# Patient Record
Sex: Male | Born: 1935 | Race: White | Hispanic: No | Marital: Married | Smoking: Current every day smoker
Health system: Southern US, Community
[De-identification: ages and names within clinical notes are randomized; demographics above are authoritative.]

## PROBLEM LIST (undated history)

## (undated) DIAGNOSIS — I059 Rheumatic mitral valve disease, unspecified: Secondary | ICD-10-CM

## (undated) DIAGNOSIS — J189 Pneumonia, unspecified organism: Secondary | ICD-10-CM

## (undated) DIAGNOSIS — I4891 Unspecified atrial fibrillation: Secondary | ICD-10-CM

## (undated) DIAGNOSIS — I10 Essential (primary) hypertension: Secondary | ICD-10-CM

## (undated) DIAGNOSIS — N400 Enlarged prostate without lower urinary tract symptoms: Secondary | ICD-10-CM

## (undated) DIAGNOSIS — E785 Hyperlipidemia, unspecified: Secondary | ICD-10-CM

## (undated) HISTORY — PX: CATARACT EXTRACTION: SUR2

## (undated) HISTORY — PX: OTHER SURGICAL HISTORY: SHX169

## (undated) HISTORY — PX: TONSILLECTOMY AND ADENOIDECTOMY: SHX28

## (undated) NOTE — *Deleted (*Deleted)
Took the patient to the bathroom to try to urinate again but was unsuccessful

---

## 2006-09-23 ENCOUNTER — Ambulatory Visit: Payer: Self-pay | Admitting: Specialist

## 2011-06-11 DIAGNOSIS — I10 Essential (primary) hypertension: Secondary | ICD-10-CM | POA: Insufficient documentation

## 2011-06-11 DIAGNOSIS — Z8619 Personal history of other infectious and parasitic diseases: Secondary | ICD-10-CM | POA: Insufficient documentation

## 2015-06-12 DIAGNOSIS — R002 Palpitations: Secondary | ICD-10-CM | POA: Insufficient documentation

## 2016-06-13 ENCOUNTER — Encounter: Payer: Self-pay | Admitting: Urology

## 2016-06-13 ENCOUNTER — Ambulatory Visit (INDEPENDENT_AMBULATORY_CARE_PROVIDER_SITE_OTHER): Payer: Medicare Other | Admitting: Urology

## 2016-06-13 DIAGNOSIS — N400 Enlarged prostate without lower urinary tract symptoms: Secondary | ICD-10-CM | POA: Insufficient documentation

## 2016-06-13 DIAGNOSIS — K759 Inflammatory liver disease, unspecified: Secondary | ICD-10-CM | POA: Insufficient documentation

## 2016-06-13 DIAGNOSIS — I10 Essential (primary) hypertension: Secondary | ICD-10-CM | POA: Insufficient documentation

## 2016-06-13 DIAGNOSIS — E782 Mixed hyperlipidemia: Secondary | ICD-10-CM | POA: Insufficient documentation

## 2016-06-13 LAB — MICROSCOPIC EXAMINATION: BACTERIA UA: NONE SEEN

## 2016-06-13 LAB — URINALYSIS, COMPLETE
BILIRUBIN UA: NEGATIVE
GLUCOSE, UA: NEGATIVE
KETONES UA: NEGATIVE
LEUKOCYTES UA: NEGATIVE
Nitrite, UA: NEGATIVE
Protein, UA: NEGATIVE
SPEC GRAV UA: 1.015 (ref 1.005–1.030)
Urobilinogen, Ur: 0.2 mg/dL (ref 0.2–1.0)
pH, UA: 7 (ref 5.0–7.5)

## 2016-06-13 LAB — BLADDER SCAN AMB NON-IMAGING: Scan Result: 78

## 2016-06-13 NOTE — Progress Notes (Signed)
06/13/2016 9:18 AM   Evan Ramirez  06-15-1930 [id number]  Referring provider: No referring provider defined for this encounter.  Chief Complaint  Patient presents with  . Follow-up    BPH    HPI: The patient is an 82 year old male with past medical history of BPH  who presents for follow up. The patient is concerned that his recent PSA was 4.39. He is having no urinary symptoms at this time. He is on no medications. He denies nocturia, frequency, urgency, or incomplete bladder emptying. His PVR 78 cc.   PMH: No past medical history on file.  Surgical History: No past surgical history on file.  Home Medications:    Medication List       Accurate as of 06/13/16  9:18 AM. Always use your most recent med list.          aspirin EC 81 MG tablet Take by mouth.   atenolol 25 MG tablet Commonly known as:  TENORMIN Take by mouth.   hydrocortisone 2.5 % cream Apply 1-2x daily to affected area on face for redness and scale   lisinopril-hydrochlorothiazide 20-12.5 MG tablet Commonly known as:  PRINZIDE,ZESTORETIC Take by mouth.   Omega-3 1000 MG Caps Take by mouth.       Allergies: Not on File  Family History: No family history on file.  Social History:  reports that he has been smoking.  He has been smoking about 0.75 packs per day. He has never used smokeless tobacco. He reports that he does not drink alcohol or use drugs.  ROS: UROLOGY Frequent Urination?: Yes Hard to postpone urination?: Yes Burning/pain with urination?: No Get up at night to urinate?: Yes Leakage of urine?: No Urine stream starts and stops?: No Trouble starting stream?: No Do you have to strain to urinate?: No Blood in urine?: Yes Urinary tract infection?: No Sexually transmitted disease?: No Injury to kidneys or bladder?: No Painful intercourse?: No Weak stream?: Yes Erection problems?: No Penile pain?: No  Gastrointestinal Nausea?: No Vomiting?:  No Indigestion/heartburn?: No Diarrhea?: No Constipation?: No  Constitutional Fever: No Night sweats?: No Weight loss?: No Fatigue?: No  Skin Skin rash/lesions?: No Itching?: No  Eyes Blurred vision?: No Double vision?: No  Ears/Nose/Throat Sore throat?: No Sinus problems?: No  Hematologic/Lymphatic Swollen glands?: No Easy bruising?: No  Cardiovascular Leg swelling?: No Chest pain?: No  Respiratory Cough?: No Shortness of breath?: No  Endocrine Excessive thirst?: No  Musculoskeletal Back pain?: No Joint pain?: No  Neurological Headaches?: No Dizziness?: No  Psychologic Depression?: No Anxiety?: No  Physical Exam: BP (!) 168/71   Pulse (!) 51   Ht 5\' 9"  (1.753 m)   Wt 173 lb 6.4 oz (78.7 kg)   BMI 25.61 kg/m   Constitutional:  Alert and oriented, No acute distress. HEENT: [place] AT, moist mucus membranes.  Trachea midline, no masses. Cardiovascular: No clubbing, cyanosis, or edema. Respiratory: Normal respiratory effort, no increased work of breathing. GI: Abdomen is soft, nontender, nondistended, no abdominal masses GU: No CVA tenderness.  DRE; 2+, smooth, benign. Skin: No rashes, bruises or suspicious lesions. Lymph: No cervical or inguinal adenopathy. Neurologic: Grossly intact, no focal deficits, moving all 4 extremities. Psychiatric: Normal mood and affect.  Laboratory Data: No results found for: WBC, HGB, HCT, MCV, PLT  No results found for: CREATININE  No results found for: PSA  No results found for: TESTOSTERONE  No results found for: HGBA1C  Urinalysis No results found for: COLORURINE, APPEARANCEUR, [place], [place], [place],  HGBUR, BILIRUBINUR, KETONESUR, PROTEINUR, UROBILINOGEN, NITRITE, LEUKOCYTESUR  Assessment & Plan:   1. BPH -asymptomatic  2. Prostate cancer screening I had a very long conversation with the patient regarding PSA testing as well as the natural history of prostate cancer. We discussed in great detail  the American urological Association's recommendation for PSA testing and management from 55-70. We also discussed it's recommended not to check PSAs after the age of 12 and at the very least stop by the age of 70. We discussed for an 82 year old that PSA of 4.39 is essentially normal. We also discussed the indolent nature of prostate cancer. We discussed autopsy studies that showed 80% of men that died from something other than prostate cancer did have prostate cancer. We discussed the goal of PSA screening is to identify younger men who have more aggressive forms of disease. We also discussed that even in younger men a large amount of prostate cancer is not even actively treated due to the risks and side effects of treatment being worse than the disease itself. We discussed that on average prostate cancer if it were to spread would not spread from 8-10 years from diagnosis and not be malignant for 13-15 years after diagnosis. We again discussed that his PSA is essentially normal at the age of 41 with 4.39. I explained to the patient the recommendation at the age of 54 with essentially normal PSA that most urologists would recommend against further prostate cancer screening that I think it would be a good idea his PSA is no longer check. The patient expressed understanding. He'll follow-up with [place] as needed.   Return if symptoms worsen or fail to improve.  Nickie Retort, MD  [facility] [address], [place], [place] [address] [phone]

## 2018-08-30 ENCOUNTER — Inpatient Hospital Stay
Admission: EM | Admit: 2018-08-30 | Discharge: 2018-09-01 | DRG: 191 | Disposition: A | Discharge status: Home / Self Care | Payer: Medicare Other | Attending: Internal Medicine | Admitting: Internal Medicine

## 2018-08-30 ENCOUNTER — Other Ambulatory Visit: Payer: Self-pay

## 2018-08-30 ENCOUNTER — Encounter: Payer: Self-pay | Admitting: *Deleted

## 2018-08-30 ENCOUNTER — Emergency Department: Payer: Medicare Other

## 2018-08-30 DIAGNOSIS — F17210 Nicotine dependence, cigarettes, uncomplicated: Secondary | ICD-10-CM | POA: Diagnosis present

## 2018-08-30 DIAGNOSIS — N179 Acute kidney failure, unspecified: Secondary | ICD-10-CM | POA: Diagnosis present

## 2018-08-30 DIAGNOSIS — Z7982 Long term (current) use of aspirin: Secondary | ICD-10-CM | POA: Diagnosis not present

## 2018-08-30 DIAGNOSIS — E782 Mixed hyperlipidemia: Secondary | ICD-10-CM | POA: Diagnosis present

## 2018-08-30 DIAGNOSIS — I4891 Unspecified atrial fibrillation: Secondary | ICD-10-CM

## 2018-08-30 DIAGNOSIS — N400 Enlarged prostate without lower urinary tract symptoms: Secondary | ICD-10-CM | POA: Diagnosis present

## 2018-08-30 DIAGNOSIS — Z9849 Cataract extraction status, unspecified eye: Secondary | ICD-10-CM | POA: Diagnosis not present

## 2018-08-30 DIAGNOSIS — I10 Essential (primary) hypertension: Secondary | ICD-10-CM | POA: Diagnosis present

## 2018-08-30 DIAGNOSIS — Z8249 Family history of ischemic heart disease and other diseases of the circulatory system: Secondary | ICD-10-CM | POA: Diagnosis not present

## 2018-08-30 DIAGNOSIS — I471 Supraventricular tachycardia, unspecified: Secondary | ICD-10-CM | POA: Diagnosis present

## 2018-08-30 DIAGNOSIS — E860 Dehydration: Secondary | ICD-10-CM | POA: Diagnosis present

## 2018-08-30 DIAGNOSIS — I480 Paroxysmal atrial fibrillation: Secondary | ICD-10-CM | POA: Diagnosis present

## 2018-08-30 DIAGNOSIS — J441 Chronic obstructive pulmonary disease with (acute) exacerbation: Principal | ICD-10-CM | POA: Diagnosis present

## 2018-08-30 DIAGNOSIS — Z79899 Other long term (current) drug therapy: Secondary | ICD-10-CM | POA: Diagnosis not present

## 2018-08-30 HISTORY — DX: Unspecified atrial fibrillation: I48.91

## 2018-08-30 HISTORY — DX: Essential (primary) hypertension: I10

## 2018-08-30 LAB — BASIC METABOLIC PANEL
Anion gap: 11 (ref 5–15)
BUN: 29 mg/dL — ABNORMAL HIGH (ref 8–23)
CO2: 23 mmol/L (ref 22–32)
CREATININE: 1.35 mg/dL — AB (ref 0.61–1.24)
Calcium: 9.3 mg/dL (ref 8.9–10.3)
Chloride: 101 mmol/L (ref 98–111)
GFR calc Af Amer: 55 mL/min — ABNORMAL LOW (ref >60)
GFR calc non Af Amer: 47 mL/min — ABNORMAL LOW (ref >60)
Glucose, Bld: 126 mg/dL — ABNORMAL HIGH (ref 70–99)
Potassium: 4.2 mmol/L (ref 3.5–5.1)
SODIUM: 135 mmol/L (ref 135–145)

## 2018-08-30 LAB — CBC
HCT: 42.6 % (ref 39.0–52.0)
Hemoglobin: 13.9 g/dL (ref 13.0–17.0)
MCH: 29.4 pg (ref 26.0–34.0)
MCHC: 32.6 g/dL (ref 30.0–36.0)
MCV: 90.3 fL (ref 80.0–100.0)
Platelets: 284 10*3/uL (ref 150–400)
RBC: 4.72 MIL/uL (ref 4.22–5.81)
RDW: 13.6 % (ref 11.5–15.5)
WBC: 10.4 10*3/uL (ref 4.0–10.5)
nRBC: 0 % (ref 0.0–0.2)

## 2018-08-30 LAB — TROPONIN I
Troponin I: 0.04 ng/mL (ref <0.03)
Troponin I: 0.05 ng/mL (ref <0.03)
Troponin I: 0.05 ng/mL (ref <0.03)

## 2018-08-30 LAB — RESPIRATORY PANEL BY PCR
ADENOVIRUS-RVPPCR: NOT DETECTED
Bordetella pertussis: NOT DETECTED
CHLAMYDOPHILA PNEUMONIAE-RVPPCR: NOT DETECTED
CORONAVIRUS NL63-RVPPCR: NOT DETECTED
Coronavirus 229E: NOT DETECTED
Coronavirus HKU1: NOT DETECTED
Coronavirus OC43: NOT DETECTED
Influenza A: NOT DETECTED
Influenza B: NOT DETECTED
Metapneumovirus: NOT DETECTED
Mycoplasma pneumoniae: NOT DETECTED
Parainfluenza Virus 1: NOT DETECTED
Parainfluenza Virus 2: NOT DETECTED
Parainfluenza Virus 3: NOT DETECTED
Parainfluenza Virus 4: NOT DETECTED
Respiratory Syncytial Virus: NOT DETECTED
Rhinovirus / Enterovirus: NOT DETECTED

## 2018-08-30 LAB — INFLUENZA PANEL BY PCR (TYPE A & B)
INFLBPCR: NEGATIVE
Influenza A By PCR: NEGATIVE

## 2018-08-30 LAB — MAGNESIUM: Magnesium: 2.2 mg/dL (ref 1.7–2.4)

## 2018-08-30 LAB — T4, FREE: Free T4: 1.42 ng/dL (ref 0.82–1.77)

## 2018-08-30 LAB — PROTIME-INR
INR: 1.14
Prothrombin Time: 14.5 seconds (ref 11.4–15.2)

## 2018-08-30 LAB — APTT: aPTT: 36 seconds (ref 24–36)

## 2018-08-30 LAB — TSH: TSH: 3.828 u[IU]/mL (ref 0.350–4.500)

## 2018-08-30 MED ORDER — ENOXAPARIN SODIUM 40 MG/0.4ML ~~LOC~~ SOLN
40.0000 mg | SUBCUTANEOUS | Status: DC
  Administered 2018-08-30 – 2018-08-31 (×2): 40 mg via SUBCUTANEOUS
  Filled 2018-08-30 (×2): qty 0.4

## 2018-08-30 MED ORDER — IPRATROPIUM-ALBUTEROL 0.5-2.5 (3) MG/3ML IN SOLN
3.0000 mL | Freq: Four times a day (QID) | RESPIRATORY_TRACT | Status: DC
  Administered 2018-08-30 – 2018-08-31 (×4): 3 mL via RESPIRATORY_TRACT
  Filled 2018-08-30 (×3): qty 3

## 2018-08-30 MED ORDER — METOPROLOL TARTRATE 25 MG PO TABS
25.0000 mg | ORAL_TABLET | ORAL | Status: AC
  Administered 2018-08-30: 25 mg via ORAL
  Filled 2018-08-30: qty 1

## 2018-08-30 MED ORDER — DILTIAZEM HCL-DEXTROSE 100-5 MG/100ML-% IV SOLN (PREMIX)
5.0000 mg/h | INTRAVENOUS | Status: DC
  Administered 2018-08-30 – 2018-09-01 (×2): 5 mg/h via INTRAVENOUS
  Filled 2018-08-30 (×5): qty 100

## 2018-08-30 MED ORDER — MAGNESIUM OXIDE 400 (241.3 MG) MG PO TABS
400.0000 mg | ORAL_TABLET | Freq: Every day | ORAL | Status: DC
  Administered 2018-08-31 – 2018-09-01 (×2): 400 mg via ORAL
  Filled 2018-08-30 (×3): qty 1

## 2018-08-30 MED ORDER — ACETAMINOPHEN 650 MG RE SUPP: 650.0000 mg | Freq: Four times a day (QID) | RECTAL | Status: DC | PRN

## 2018-08-30 MED ORDER — SODIUM CHLORIDE 0.9 % IV BOLUS
1000.0000 mL | Freq: Once | INTRAVENOUS | Status: AC
  Administered 2018-08-30: 1000 mL via INTRAVENOUS

## 2018-08-30 MED ORDER — METOPROLOL TARTRATE 25 MG PO TABS
25.0000 mg | ORAL_TABLET | Freq: Three times a day (TID) | ORAL | Status: DC
  Administered 2018-08-30 – 2018-09-01 (×6): 25 mg via ORAL
  Filled 2018-08-30 (×6): qty 1

## 2018-08-30 MED ORDER — ONDANSETRON HCL 4 MG PO TABS: 4.0000 mg | ORAL_TABLET | Freq: Four times a day (QID) | ORAL | Status: DC | PRN

## 2018-08-30 MED ORDER — ACETAMINOPHEN 325 MG PO TABS: 650.0000 mg | ORAL_TABLET | Freq: Four times a day (QID) | ORAL | Status: DC | PRN

## 2018-08-30 MED ORDER — ONDANSETRON HCL 4 MG/2ML IJ SOLN: 4.0000 mg | Freq: Four times a day (QID) | INTRAMUSCULAR | Status: DC | PRN

## 2018-08-30 MED ORDER — ASPIRIN EC 81 MG PO TBEC
81.0000 mg | DELAYED_RELEASE_TABLET | Freq: Every day | ORAL | Status: DC
  Administered 2018-08-31 – 2018-09-01 (×2): 81 mg via ORAL
  Filled 2018-08-30 (×3): qty 1

## 2018-08-30 MED ORDER — METOPROLOL TARTRATE 5 MG/5ML IV SOLN
5.0000 mg | Freq: Once | INTRAVENOUS | Status: AC
  Administered 2018-08-30: 5 mg via INTRAVENOUS
  Filled 2018-08-30: qty 5

## 2018-08-30 MED ORDER — METHYLPREDNISOLONE SODIUM SUCC 40 MG IJ SOLR
40.0000 mg | Freq: Every day | INTRAMUSCULAR | Status: DC
  Administered 2018-08-30 – 2018-09-01 (×3): 40 mg via INTRAVENOUS
  Filled 2018-08-30 (×4): qty 1

## 2018-08-30 MED ORDER — BUDESONIDE 0.5 MG/2ML IN SUSP
0.5000 mg | Freq: Two times a day (BID) | RESPIRATORY_TRACT | Status: DC
  Administered 2018-08-30 – 2018-09-01 (×4): 0.5 mg via RESPIRATORY_TRACT
  Filled 2018-08-30 (×6): qty 2

## 2018-08-30 MED ORDER — DILTIAZEM HCL 30 MG PO TABS
30.0000 mg | ORAL_TABLET | Freq: Four times a day (QID) | ORAL | Status: DC
  Administered 2018-08-30 – 2018-08-31 (×3): 30 mg via ORAL
  Filled 2018-08-30 (×3): qty 1

## 2018-08-30 MED ORDER — MAGNESIUM SULFATE 2 GM/50ML IV SOLN
2.0000 g | Freq: Once | INTRAVENOUS | Status: AC
  Administered 2018-08-30: 2 g via INTRAVENOUS
  Filled 2018-08-30: qty 50

## 2018-08-30 NOTE — ED Notes (Addendum)
Pt c/o of weakness and SOB, reports hx of same when afib "acts up"   Pt denies taking blood thinners except dailey 81mg ASA

## 2018-08-30 NOTE — ED Provider Notes (Addendum)
[facility] Emergency Department Provider Note  ____________________________________________  Time seen: Approximately 7:15 AM  I have reviewed the triage vital signs and the nursing notes.   HISTORY  Chief Complaint Palpitations    HPI Evan Ramirez is a 84 y.o. male with a history of hyperlipidemia and hypertension who comes to the ED complaining of shortness of breath, worsening for the past week associated with orthostatic dizziness, episodic chest pressure that is nonradiating and nonexertional and nonpleuritic.  Denies cough vomiting or diarrhea but he does report decreased oral intake.  When the symptoms started a week ago, the patient thought maybe his metoprolol was contributing to his feeling poorly so he stopped taking it.  After a few days, he was feeling worse and so he restarted the metoprolol again about 2 days ago without relief of his symptoms.     History reviewed. No pertinent past medical history.   Patient Active Problem List   Diagnosis Date Noted  . BPH (benign prostatic hyperplasia) 06/13/2016  . Hepatitis 06/13/2016  . Mixed hyperlipidemia 06/13/2016  . Hypertension 06/13/2016  . Palpitations 06/12/2015  . Benign essential hypertension 06/11/2011  . History of hepatitis 06/11/2011     History reviewed. No pertinent surgical history.   Prior to Admission medications   Medication Sig Start Date End Date Taking? Authorizing Provider  aspirin EC 81 MG tablet Take 81 mg by mouth daily.  05/08/10  Yes [provider]  lisinopril-hydrochlorothiazide (PRINZIDE,ZESTORETIC) 20-12.5 MG tablet Take 1 tablet by mouth daily. 07/21/18 07/22/19 Yes [provider]  magnesium oxide (MAG-OX) 400 MG tablet Take 400 mg by mouth daily. 09/03/17 09/03/18 Yes [provider]  metoprolol succinate (TOPROL-XL) 50 MG 24 hr tablet Take 50 mg by mouth every evening.  06/02/18 06/02/19 Yes [provider]      Allergies Patient has no known allergies.   History reviewed. No pertinent family history.  Social History Social History   Tobacco Use  . Smoking status: Current Every Day Smoker    Packs/day: 0.75    Types: Cigarettes  . Smokeless tobacco: Never Used  Substance Use Topics  . Alcohol use: No  . Drug use: No    Review of Systems  Constitutional:   No fever or chills.  ENT:   No sore throat. No rhinorrhea. Cardiovascular:   Positive as above vague chest pain without syncope. Respiratory: Positive shortness of breath without cough. Gastrointestinal:   Negative for abdominal pain, vomiting and diarrhea.  Musculoskeletal:   Negative for focal pain or swelling All other systems reviewed and are negative except as documented above in ROS and HPI.  ____________________________________________   PHYSICAL EXAM:  VITAL SIGNS: ED Triage Vitals  Enc Vitals Group     BP 08/30/18 0622 (!) 140/94     Pulse Rate 08/30/18 0622 (!) 146     Resp 08/30/18 0622 (!) 23     Temp 08/30/18 0622 97.6 F (36.4 C)     Temp Source 08/30/18 0622 Oral     SpO2 08/30/18 0622 98 %     Weight 08/30/18 0627 160 lb (72.6 kg)     Height 08/30/18 0627 5\' 8"  (1.727 m)     Head Circumference --      Peak Flow --      Pain Score 08/30/18 0626 3     Pain Loc --      Pain Edu? --      Excl. in [place]? --  Vital signs reviewed, nursing assessments reviewed.   Constitutional:   Alert and oriented. Non-toxic appearance. Eyes:   Conjunctivae are normal. EOMI. PERRL. ENT      Head:   Normocephalic and atraumatic.      Nose:   No congestion/rhinnorhea.       Mouth/Throat:   MMM, no pharyngeal erythema. No peritonsillar mass.       Neck:   No meningismus. Full ROM. Hematological/Lymphatic/Immunilogical:   No cervical lymphadenopathy. Cardiovascular:   Irregularly irregular rhythm, heart rate 150. Symmetric bilateral radial and DP pulses.  No murmurs. Cap refill less than 2  seconds. Respiratory:   Normal respiratory effort without tachypnea/retractions. Breath sounds are clear and equal bilaterally. No wheezes/rales/rhonchi. Gastrointestinal:   Soft and nontender. Non distended. There is no CVA tenderness.  No rebound, rigidity, or guarding. Musculoskeletal:   Normal range of motion in all extremities. No joint effusions.  No lower extremity tenderness.  No edema. Neurologic:   Normal speech and language.  Motor grossly intact. No acute focal neurologic deficits are appreciated.  Skin:    Skin is warm, dry and intact. No rash noted.  No petechiae, purpura, or bullae.  ____________________________________________    LABS (pertinent positives/negatives) (all labs ordered are listed, but only abnormal results are displayed) Labs Reviewed  BASIC METABOLIC PANEL - Abnormal; Notable for the following components:      Result Value   Glucose, Bld 126 (*)    BUN 29 (*)    Creatinine, Ser 1.35 (*)    GFR calc non Af Amer 47 (*)    GFR calc Af Amer 55 (*)    All other components within normal limits  TROPONIN I - Abnormal; Notable for the following components:   Troponin I 0.04 (*)    All other components within normal limits  CBC  TSH  T4, FREE  MAGNESIUM  APTT  PROTIME-INR   ____________________________________________   EKG  Interpreted by me Atrial fibrillation, rate of 146, normal axis.  Prolonged QTC of 501 ms.  Normal QRS ST segments and T waves.  1 PVC on the strip.  ____________________________________________    RADIOLOGY  Dg Chest 2 View  Result Date: 08/30/2018 CLINICAL DATA:  Acute onset of palpitations and shortness of breath. EXAM: CHEST - 2 VIEW COMPARISON:  None. FINDINGS: The lungs are well-aerated. Minimal bilateral atelectasis is noted. There is no evidence of pleural effusion or pneumothorax. The heart is normal in size; the mediastinal contour is within normal limits. No acute osseous abnormalities are seen. IMPRESSION:  Minimal bilateral atelectasis noted. Lungs otherwise clear. Electronically Signed   By: Garald Balding M.D.   On: 08/30/2018 06:52    ____________________________________________   PROCEDURES .Critical Care Performed by: Carrie Mew, MD Authorized by: Carrie Mew, MD   Critical care provider statement:    Critical care time (minutes):  35   Critical care time was exclusive of:  Separately billable procedures and treating other patients   Critical care was necessary to treat or prevent imminent or life-threatening deterioration of the following conditions:  Cardiac failure   Critical care was time spent personally by me on the following activities:  Development of treatment plan with patient or surrogate, discussions with consultants, evaluation of patient's response to treatment, examination of patient, obtaining history from patient or surrogate, ordering and performing treatments and interventions, ordering and review of laboratory studies, ordering and review of radiographic studies, pulse oximetry, re-evaluation of patient's condition and review of old charts  ____________________________________________  DIFFERENTIAL DIAGNOSIS   Dehydration, A. fib with RVR, electrolyte disturbance, hyperthyroidism  CLINICAL IMPRESSION / ASSESSMENT AND PLAN / ED COURSE  Pertinent labs & imaging results that were available during my care of the patient were reviewed by me and considered in my medical decision making (see chart for details).   For the A. Fib/RVR, I will start with IV metoprolol since the patient is already on extended release metoprolol to avoid possible excess AV nodal blockade.  However, if ineffective may need to switch to diltiazem which can be titrated IV.   Clinical Course as of Aug 30 942  Sun Aug 30, 2018  0715 P/w afib/rvr, possibly  secondary to metop noncompliance. Cxr = nad, no ptx/edema/pna. Pt symptomatic.  Will try to rate control with IV metoprolol.  If unsuccessful will need to start infusion and admit   [PS]  0732 Bun/cr ratio suggests dehydration. Will trial IVF.   BUN(!): 29 [PS]  0752 Pulse Rate(!): 47 [PS]    Clinical Course User Index [PS] Carrie Mew, MD    ----------------------------------------- 9:30 AM on 08/30/2018 -----------------------------------------  Inadequate response to IV metoprolol, so the patient was started on diltiazem drip without bolus to avoid excess AV nodal blockade with combination beta-blocker calcium channel blocker.[place] Kitchen  Despite the documented low heart rates, the patient's heart rate has remained 1 30-1 50, atrial fibrillation.  Currently heart rate ranging 1 15-1 2 5.  Hospitalist paged for further management   ____________________________________________   FINAL CLINICAL IMPRESSION(S) / ED DIAGNOSES    Final diagnoses:  Atrial fibrillation with RVR [facility])     ED Discharge Orders    None      Portions of this note were generated with dragon dictation software. Dictation errors may occur despite best attempts at proofreading.   Carrie Mew, MD 08/30/18 [place] Done    Carrie Mew, MD 08/30/18 [phone]

## 2018-08-30 NOTE — Progress Notes (Signed)
Patient ID: Evan Ramirez, male   DOB: Apr 09, 1930, 84 y.o.   MRN: [id number]  ACP note  Patient and wife present  Diagnosis: SVT with rapid atrial fibrillation, COPD exacerbation, hypertension, tobacco abuse, dehydration  CODE STATUS discussed and patient wishes to be a full code  Plan.  Control atrial fibrillation with Cardizem drip and try to taper that off and start oral Cardizem, oral metoprolol treat COPD exacerbation with Solu-Medrol nebulizer treatments and IV magnesium  Time spent on ACP discussion 17 minutes Dr. Loletha Grayer

## 2018-08-30 NOTE — Consult Note (Signed)
Reason for Consult: Palpitation rapid atrial fibrillation Referring Physician: Dr. Loletha Grayer hospitalist, Dr. Kym Groom primary Cardiologist Dr. Dyann Kief Evan Ramirez is an 84 y.o. male.  HPI: Patient history of known atrial fibrillation and SVT in the past states that he is been feeling nauseated fluttering in his chest over the last several days.  Patient complains of weakness no energy blood pressure has been high occasionally chest pain.  Patient states he stopped his metoprolol 3 days ago and did not make a difference in the way he was feeling.  Patient states SVT rapid atrial fibrillation.  Patient admitted to the hospitalist service placed on Cardizem for rate now it is much improved he feels somewhat better.  Patient states he is not been on anticoagulation not here with rapid atrial fibrillation rate is much improved.  Past Medical History:  Diagnosis Date  . Atrial fibrillation ([place])   . Hypertension     Past Surgical History:  Procedure Laterality Date  . CATARACT EXTRACTION    . ear drum surgery    . TONSILLECTOMY AND ADENOIDECTOMY      Family History  Problem Relation Age of Onset  . Parkinson's disease Mother   . CVA Father   . CAD Father     Social History:  reports that he has been smoking cigarettes. He has been smoking about 0.75 packs per day. He has never used smokeless tobacco. He reports that he does not drink alcohol or use drugs.  Allergies: No Known Allergies  Medications: I have reviewed the patient's current medications.  Results for orders placed or performed during the hospital encounter of 08/30/18 (from the past 48 hour(s))  Basic metabolic panel     Status: Abnormal   Collection Time: 08/30/18  7:03 AM  Result Value Ref Range   Sodium 135 135 - 145 mmol/L   Potassium 4.2 3.5 - 5.1 mmol/L   Chloride 101 98 - 111 mmol/L   CO2 23 22 - 32 mmol/L   Glucose, Bld 126 (H) 70 - 99 mg/dL   BUN 29 (H) 8 - 23 mg/dL   Creatinine, Ser 1.35 (H) 0.61 -  1.24 mg/dL   Calcium 9.3 8.9 - 10.3 mg/dL   GFR calc non Af Amer 47 (L) >60 mL/min   GFR calc Af Amer 55 (L) >60 mL/min   Anion gap 11 5 - 15    Comment: Performed at [facility], [address] [place], [place] [address]  CBC     Status: None   Collection Time: 08/30/18  7:03 AM  Result Value Ref Range   WBC 10.4 4.0 - 10.5 K/uL   RBC 4.72 4.22 - 5.81 MIL/uL   Hemoglobin 13.9 13.0 - 17.0 g/dL   HCT 42.6 39.0 - 52.0 %   MCV 90.3 80.0 - 100.0 fL   MCH 29.4 26.0 - 34.0 pg   MCHC 32.6 30.0 - 36.0 g/dL   RDW 13.6 11.5 - 15.5 %   Platelets 284 150 - 400 K/uL   nRBC 0.0 0.0 - 0.2 %    Comment: Performed at [facility], [address] [place], [place] [address]  Troponin I - Add-On to previous collection     Status: Abnormal   Collection Time: 08/30/18  7:03 AM  Result Value Ref Range   Troponin I 0.04 (HH) <0.03 ng/mL    Comment: CRITICAL RESULT CALLED TO, READ BACK BY AND VERIFIED WITH ANNA HOLT  AT 8295 ON 08/30/18 BY SNJ. Performed  at [facility], [address] [place], [place] [address]   TSH     Status: None   Collection Time: 08/30/18  7:03 AM  Result Value Ref Range   TSH 3.828 0.350 - 4.500 uIU/mL    Comment: Performed by a 3rd Generation assay with a functional sensitivity of <=0.01 uIU/mL. Performed at [facility], [address] [place], [place] [address]   T4, free     Status: None   Collection Time: 08/30/18  7:03 AM  Result Value Ref Range   Free T4 1.42 0.82 - 1.77 ng/dL    Comment: (NOTE) Biotin ingestion may interfere with free T4 tests. If the results are inconsistent with the TSH level, previous test results, or the clinical presentation, then consider biotin interference. If needed, order repeat testing after stopping biotin. Performed at [facility], [address] [place], [place] [address]   Magnesium     Status: None   Collection Time: 08/30/18  7:03 AM  Result Value Ref Range   Magnesium  2.2 1.7 - 2.4 mg/dL    Comment: Performed at [facility], [address] [place], [place] [address]  APTT     Status: None   Collection Time: 08/30/18  7:03 AM  Result Value Ref Range   aPTT 36 24 - 36 seconds    Comment: Performed at [facility], [address] [place], [place] [address]  Protime-INR     Status: None   Collection Time: 08/30/18  7:03 AM  Result Value Ref Range   Prothrombin Time 14.5 11.4 - 15.2 seconds   INR 1.14     Comment: Performed at [facility], [address] [place], [place] [address]  Troponin I - Once     Status: Abnormal   Collection Time: 08/30/18 12:10 PM  Result Value Ref Range   Troponin I 0.05 (HH) <0.03 ng/mL    Comment: CRITICAL VALUE NOTED. VALUE IS CONSISTENT WITH PREVIOUSLY REPORTED/CALLED VALUE Performed at [facility], [address] [place], [place] [address]   Influenza panel by PCR (type A & B)     Status: None   Collection Time: 08/30/18  1:01 PM  Result Value Ref Range   Influenza A By PCR NEGATIVE NEGATIVE   Influenza B By PCR NEGATIVE NEGATIVE    Comment: (NOTE) The Xpert Xpress Flu assay is intended as an aid in the diagnosis of  influenza and should not be used as a sole basis for treatment.  This  assay is FDA approved for nasopharyngeal swab specimens only. Nasal  washings and aspirates are unacceptable for Xpert Xpress Flu testing. Performed at [facility], [address] [place], [place] [address]   Troponin I - Once     Status: Abnormal   Collection Time: 08/30/18  3:05 PM  Result Value Ref Range   Troponin I 0.05 (HH) <0.03 ng/mL    Comment: CRITICAL VALUE NOTED. VALUE IS CONSISTENT WITH PREVIOUSLY REPORTED/CALLED VALUE TTG Performed at [facility], [address] [place], [place] [address]     Dg Chest 2 View  Result Date: 08/30/2018 CLINICAL DATA:  Acute onset of palpitations and shortness of breath. EXAM: CHEST - 2 VIEW COMPARISON:  None.  FINDINGS: The lungs are well-aerated. Minimal bilateral atelectasis is noted. There is no evidence of pleural effusion or pneumothorax. The heart is normal in size; the mediastinal contour is within normal limits. No acute osseous abnormalities are seen. IMPRESSION: Minimal  bilateral atelectasis noted. Lungs otherwise clear. Electronically Signed   By: Garald Balding M.D.   On: 08/30/2018 06:52    Review of Systems  Constitutional: Positive for diaphoresis and malaise/fatigue.  HENT: Positive for congestion.   Eyes: Negative.   Respiratory: Positive for shortness of breath.   Cardiovascular: Positive for palpitations.  Gastrointestinal: Positive for heartburn.  Genitourinary: Negative.   Musculoskeletal: Negative.   Skin: Negative.   Neurological: Negative.   Endo/Heme/Allergies: Negative.   Psychiatric/Behavioral: Negative.    Blood pressure 126/80, pulse 74, temperature (!) 97.5 F (36.4 C), temperature source Oral, resp. rate (!) 24, height 5\' 8"  (1.727 m), weight 72.6 kg, SpO2 96 %. Physical Exam  Nursing note and vitals reviewed. Constitutional: He is oriented to person, place, and time. He appears well-developed and well-nourished.  HENT:  Head: Normocephalic and atraumatic.  Eyes: Pupils are equal, round, and reactive to light. Conjunctivae and EOM are normal.  Neck: Neck supple.  Cardiovascular: Normal heart sounds. An irregularly irregular rhythm present. Tachycardia present.  Respiratory: Effort normal and breath sounds normal.  GI: Soft. Bowel sounds are normal.  Musculoskeletal: Normal range of motion.  Neurological: He is alert and oriented to person, place, and time. He has normal reflexes.  Skin: Skin is warm and dry.  Psychiatric: He has a normal mood and affect.    Assessment/Plan: Atrial fibrillation rapid ventricular response now rate controlled on IV diltiazem COPD Hypertension Smoking Dehydration . Plan Agree with admit to telemetry Rule out for  myocardial infarction Recommend rate control with IV diltiazem In addition to p.o. Cardizem probably 120 CD twice a day or 240 once a day Anticoagulation with heparin IV In addition to Eliquis 5 twice a day Advised patient refrain from tobacco abuse Continue inhaler therapy for COPD As the patient follow-up with cardiology as an outpatient   Levy Wellman D Ayumi Wangerin 08/30/2018, 5:39 PM

## 2018-08-30 NOTE — H&P (Signed)
[facility] [place] NAME: Evan Ramirez    MR#:  [id number]  DATE OF BIRTH:  [birth year]  DATE OF ADMISSION:  08/30/2018  PRIMARY CARE PHYSICIAN: Valera Castle, MD  REQUESTING/REFERRING PHYSICIAN: Dr. Brenton Grills.  CHIEF COMPLAINT:   Chief Complaint  Patient presents with  . Palpitations    HISTORY OF PRESENT ILLNESS:  Evan Ramirez  is a 84 y.o. male with a known history of atrial fibrillation and SVT.  He states that he has been feeling very nauseated recently and his heart has been fluttering for several days.  He has been very weak and no energy.  His blood pressure has been high on the bottom number.  Occasionally gets chest pain but did not describe this very well.  He stated he stopped his metoprolol for 3 days and it did not make a difference on the way he was feeling.  The patient was in SVT, rapid atrial fibrillation on presentation hospitalist services were contacted for further evaluation.  The patient was placed on a Cardizem drip in the emergency room.  PAST MEDICAL HISTORY:   Past Medical History:  Diagnosis Date  . Atrial fibrillation ([place])   . Hypertension     PAST SURGICAL HISTORY:   Past Surgical History:  Procedure Laterality Date  . CATARACT EXTRACTION    . ear drum surgery    . TONSILLECTOMY AND ADENOIDECTOMY      SOCIAL HISTORY:   Social History   Tobacco Use  . Smoking status: Current Every Day Smoker    Packs/day: 0.75    Types: Cigarettes  . Smokeless tobacco: Never Used  Substance Use Topics  . Alcohol use: No    FAMILY HISTORY:   Family History  Problem Relation Age of Onset  . Parkinson's disease Mother   . CVA Father   . CAD Father     DRUG ALLERGIES:  No Known Allergies  REVIEW OF SYSTEMS:  CONSTITUTIONAL: No fever, chills or sweats.  Positive for weakness.    EYES: Positive for poor vision. EARS, NOSE, AND THROAT: No tinnitus or ear pain. No sore throat.   Positive for runny nose. RESPIRATORY: No cough.  always has shortness of breath.  No wheezing or hemoptysis.  CARDIOVASCULAR: Occasional chest pain, no orthopnea, edema.  Positive for palpitations GASTROINTESTINAL: No nausea, vomiting, diarrhea or abdominal pain. No blood in bowel movements.  Sometimes has bowel incontinence GENITOURINARY: No dysuria, hematuria.  ENDOCRINE: No polyuria, nocturia,  HEMATOLOGY: No anemia, easy bruising or bleeding SKIN: No rash or lesion. MUSCULOSKELETAL: No joint pain or arthritis.   NEUROLOGIC: No tingling, numbness, weakness.  Positive for dizziness. PSYCHIATRY: No anxiety or depression.   MEDICATIONS AT HOME:   Prior to Admission medications   Medication Sig Start Date End Date Taking? Authorizing Provider  aspirin EC 81 MG tablet Take 81 mg by mouth daily.  05/08/10  Yes [provider]  lisinopril-hydrochlorothiazide (PRINZIDE,ZESTORETIC) 20-12.5 MG tablet Take 1 tablet by mouth daily. 07/21/18 07/22/19 Yes [provider]  magnesium oxide (MAG-OX) 400 MG tablet Take 400 mg by mouth daily. 09/03/17 09/03/18 Yes [provider]  metoprolol succinate (TOPROL-XL) 50 MG 24 hr tablet Take 50 mg by mouth every evening.  06/02/18 06/02/19 Yes [provider]      VITAL SIGNS:  Blood pressure 125/89, pulse 68, temperature (!) 97.5 F (36.4 C), temperature source Oral, resp. rate (!) 24, height 5\' 8"  (1.727 m), weight 72.6 kg, SpO2 95 %.  PHYSICAL EXAMINATION:  GENERAL:  84 y.o.-year-old patient lying in the bed with no acute distress.  EYES: Pupils equal, round, reactive to light and accommodation. No scleral icterus. Extraocular muscles intact.  HEENT: Head atraumatic, normocephalic. Oropharynx and nasopharynx clear.  NECK:  Supple, no jugular venous distention. No thyroid enlargement, no tenderness.  LUNGS: Decreased breath sounds bilaterally, positive expiratory wheezing.  No rales,rhonchi or crepitation. No use of accessory  muscles of respiration.  CARDIOVASCULAR: S1, S2 irregular regular tachycardic. No murmurs, rubs, or gallops.  ABDOMEN: Soft, nontender, nondistended. Bowel sounds present. No organomegaly or mass.  EXTREMITIES: No pedal edema, cyanosis, or clubbing.  NEUROLOGIC: Cranial nerves II through XII are intact. Muscle strength 5/5 in all extremities. Sensation intact. Gait not checked.  PSYCHIATRIC: The patient is alert and oriented x 3.  SKIN: No rash, lesion, or ulcer.   LABORATORY PANEL:   CBC Recent Labs  Lab 08/30/18 0703  WBC 10.4  HGB 13.9  HCT 42.6  PLT 284   ------------------------------------------------------------------------------------------------------------------  Chemistries  Recent Labs  Lab 08/30/18 0703  NA 135  K 4.2  CL 101  CO2 23  GLUCOSE 126*  BUN 29*  CREATININE 1.35*  CALCIUM 9.3  MG 2.2   ------------------------------------------------------------------------------------------------------------------  Cardiac Enzymes Recent Labs  Lab 08/30/18 1210  TROPONINI 0.05*   ------------------------------------------------------------------------------------------------------------------  RADIOLOGY:  Dg Chest 2 View  Result Date: 08/30/2018 CLINICAL DATA:  Acute onset of palpitations and shortness of breath. EXAM: CHEST - 2 VIEW COMPARISON:  None. FINDINGS: The lungs are well-aerated. Minimal bilateral atelectasis is noted. There is no evidence of pleural effusion or pneumothorax. The heart is normal in size; the mediastinal contour is within normal limits. No acute osseous abnormalities are seen. IMPRESSION: Minimal bilateral atelectasis noted. Lungs otherwise clear. Electronically Signed   By: Garald Balding M.D.   On: 08/30/2018 06:52    EKG:   SVT 146 bpm  IMPRESSION AND PLAN:   1.  SVT and rapid atrial fibrillation.  Patient placed on Cardizem drip in the ER.  Start his oral metoprolol back at a higher dose.  Echocardiogram cardiology  consultation.  Admit to telemetry. 2.  COPD exacerbation start Solu-Medrol, give IV magnesium.  Start nebulizer treatments. 3.  Hypertension.  Patient on Cardizem drip.  Restart higher dose metoprolol.  Can continue lisinopril HCT 4.  Tobacco abuse.  Smoking cessation counseling done 4 minutes by me.  Refused nicotine patch 5.  Dehydration, acute kidney injury.  Gentle fluids.  All the records are reviewed and case discussed with ED provider. Management plans discussed with the patient, family and they are in agreement.  CODE STATUS: Full Code  TOTAL TIME TAKING CARE OF THIS PATIENT: 50 minutes, including ACP time.    Loletha Grayer M.D on 08/30/2018 at 1:24 PM  Between 7am to 6pm - Pager - [phone]  After 6pm call admission pager [phone]  [facility]  [phone]  CC: Primary care physician; Valera Castle, MD

## 2018-08-30 NOTE — ED Triage Notes (Signed)
Pt presents w/ c/o heart racing since Friday. Pt states hx of a-fib. Pt c/o shortness of breath. Pt is pale.

## 2018-08-31 LAB — CBC
HCT: 41.3 % (ref 39.0–52.0)
Hemoglobin: 13.5 g/dL (ref 13.0–17.0)
MCH: 29.9 pg (ref 26.0–34.0)
MCHC: 32.7 g/dL (ref 30.0–36.0)
MCV: 91.4 fL (ref 80.0–100.0)
Platelets: 257 10*3/uL (ref 150–400)
RBC: 4.52 MIL/uL (ref 4.22–5.81)
RDW: 13.7 % (ref 11.5–15.5)
WBC: 8 10*3/uL (ref 4.0–10.5)
nRBC: 0 % (ref 0.0–0.2)

## 2018-08-31 LAB — BASIC METABOLIC PANEL
Anion gap: 11 (ref 5–15)
BUN: 31 mg/dL — ABNORMAL HIGH (ref 8–23)
CO2: 22 mmol/L (ref 22–32)
Calcium: 9.3 mg/dL (ref 8.9–10.3)
Chloride: 101 mmol/L (ref 98–111)
Creatinine, Ser: 1.12 mg/dL (ref 0.61–1.24)
GFR calc Af Amer: >60 mL/min (ref >60)
GFR calc non Af Amer: 59 mL/min — ABNORMAL LOW (ref >60)
Glucose, Bld: 152 mg/dL — ABNORMAL HIGH (ref 70–99)
Potassium: 4.1 mmol/L (ref 3.5–5.1)
Sodium: 134 mmol/L — ABNORMAL LOW (ref 135–145)

## 2018-08-31 MED ORDER — IPRATROPIUM-ALBUTEROL 0.5-2.5 (3) MG/3ML IN SOLN: 3.0000 mL | RESPIRATORY_TRACT | Status: DC | PRN

## 2018-08-31 MED ORDER — DILTIAZEM HCL ER COATED BEADS 120 MG PO CP24: 240.0000 mg | ORAL_CAPSULE | Freq: Every day | ORAL | Status: DC

## 2018-08-31 MED ORDER — AMIODARONE HCL 200 MG PO TABS
200.0000 mg | ORAL_TABLET | Freq: Two times a day (BID) | ORAL | Status: DC
  Administered 2018-09-01: 200 mg via ORAL
  Filled 2018-08-31: qty 1

## 2018-08-31 MED ORDER — DILTIAZEM LOAD VIA INFUSION
15.0000 mg | Freq: Once | INTRAVENOUS | Status: AC
  Administered 2018-08-31: 15 mg via INTRAVENOUS
  Filled 2018-08-31: qty 15

## 2018-08-31 MED ORDER — AMIODARONE HCL 200 MG PO TABS
400.0000 mg | ORAL_TABLET | Freq: Two times a day (BID) | ORAL | Status: DC
  Administered 2018-08-31: 400 mg via ORAL
  Filled 2018-08-31: qty 2

## 2018-08-31 NOTE — Progress Notes (Signed)
[facility] at [place] NAME: Evan Ramirez    MR#:  [id number]  DATE OF BIRTH:  1930/04/23  SUBJECTIVE:  CHIEF COMPLAINT:   Chief Complaint  Patient presents with  . Palpitations   SOB is better but has palpitations and HR into 150s  REVIEW OF SYSTEMS:    Review of Systems  Constitutional: Positive for malaise/fatigue. Negative for chills and fever.  HENT: Negative for sore throat.   Eyes: Negative for blurred vision, double vision and pain.  Respiratory: Positive for shortness of breath. Negative for cough, hemoptysis and wheezing.   Cardiovascular: Positive for palpitations. Negative for chest pain, orthopnea and leg swelling.  Gastrointestinal: Negative for abdominal pain, constipation, diarrhea, heartburn, nausea and vomiting.  Genitourinary: Negative for dysuria and hematuria.  Musculoskeletal: Negative for back pain and joint pain.  Skin: Negative for rash.  Neurological: Negative for sensory change, speech change, focal weakness and headaches.  Endo/Heme/Allergies: Does not bruise/bleed easily.  Psychiatric/Behavioral: Negative for depression. The patient is not nervous/anxious.     DRUG ALLERGIES:  No Known Allergies  VITALS:  Blood pressure 122/80, pulse 84, temperature 97.7 F (36.5 C), temperature source Oral, resp. rate 18, height 5\' 8"  (1.727 m), weight 72.6 kg, SpO2 96 %.  PHYSICAL EXAMINATION:   Physical Exam  GENERAL:  84 y.o.-year-old patient lying in the bed EYES: Pupils equal, round, reactive to light and accommodation. No scleral icterus. Extraocular muscles intact.  HEENT: Head atraumatic, normocephalic. Oropharynx and nasopharynx clear.  NECK:  Supple, no jugular venous distention. No thyroid enlargement, no tenderness.  LUNGS: Normal breath sounds bilaterally, no wheezing, rales, rhonchi. No use of accessory muscles of respiration.  CARDIOVASCULAR: Irregularly irregular, tachycardic ABDOMEN: Soft, nontender,  nondistended. Bowel sounds present. No organomegaly or mass.  EXTREMITIES: No cyanosis, clubbing or edema b/l.    NEUROLOGIC: Cranial nerves II through XII are intact. No focal Motor or sensory deficits b/l.   PSYCHIATRIC: The patient is alert and oriented x 3.  SKIN: No obvious rash, lesion, or ulcer.   LABORATORY PANEL:   CBC Recent Labs  Lab 08/31/18 0404  WBC 8.0  HGB 13.5  HCT 41.3  PLT 257   ------------------------------------------------------------------------------------------------------------------ Chemistries  Recent Labs  Lab 08/30/18 0703 08/31/18 0404  NA 135 134*  K 4.2 4.1  CL 101 101  CO2 23 22  GLUCOSE 126* 152*  BUN 29* 31*  CREATININE 1.35* 1.12  CALCIUM 9.3 9.3  MG 2.2  --    ------------------------------------------------------------------------------------------------------------------  Cardiac Enzymes Recent Labs  Lab 08/30/18 1505  TROPONINI 0.05*   ------------------------------------------------------------------------------------------------------------------  RADIOLOGY:  Dg Chest 2 View  Result Date: 08/30/2018 CLINICAL DATA:  Acute onset of palpitations and shortness of breath. EXAM: CHEST - 2 VIEW COMPARISON:  None. FINDINGS: The lungs are well-aerated. Minimal bilateral atelectasis is noted. There is no evidence of pleural effusion or pneumothorax. The heart is normal in size; the mediastinal contour is within normal limits. No acute osseous abnormalities are seen. IMPRESSION: Minimal bilateral atelectasis noted. Lungs otherwise clear. Electronically Signed   By: Garald Balding M.D.   On: 08/30/2018 06:52     ASSESSMENT AND PLAN:   * Afib with RVR Rapid ventricular rate with heart rate in the 150s.  Started on Cardizem drip.  Oral amiodarone ordered by cardiology. Patient continues to be in the 130s.  Will reassess later at noon. Echocardiogram ordered and pending  *  COPD exacerbation Improving.  IV Solu-Medrol.   Nebulizers.  *  Hypertension.   Patient started on Cardizem drip.  Restart higher dose metoprolol.   lisinopril HCT  * Tobacco abuse.  Smoking cessation counseling done 4 minutes by me.  Refused nicotine patch  *  Dehydration, acute kidney injury.  Gentle fluids.  All the records are reviewed and case discussed with Care Management/Social Worker Management plans discussed with the patient, family and they are in agreement.  CODE STATUS: FULL CODE  DVT Prophylaxis: SCDs  TOTAL CRITICAL CARE TIME TAKING CARE OF THIS PATIENT: 35 minutes  POSSIBLE D/C IN 1-2 DAYS, DEPENDING ON CLINICAL CONDITION.  Leia Alf Evone Arseneau M.D on 08/31/2018 at 11:28 AM  Between 7am to 6pm - Pager - [phone]  After 6pm go to [email or web address] - password [email or web address] [facility]  Office  [phone]  CC: Primary care physician; Valera Castle, MD  Note: This dictation was prepared with Dragon dictation along with smaller phrase technology. Any transcriptional errors that result from this process are unintentional.

## 2018-08-31 NOTE — Progress Notes (Signed)
Patients HR in 150's, MD and cardiology notified, orders to restart diltiazem gtt at 10mg  with a 15mg  bolus and start patient on PO amiodarone 200mg  bid, will give and continue to monitor.

## 2018-08-31 NOTE — Care Management Note (Signed)
Case Management Note  Patient Details  Name: Evan Ramirez MRN: [id number] Date of Birth: 08-09-1930  Subjective/Objective:      Patient is from home with wife.  Admitted with supraventricular tachycardia.  New prescription for Eliquis.  Called [facility] and the price will be $260/month.  30 day free coupon given to patient and pharmacy verbalized they will honor coupon.  He is current with PCP and cardiologist.  Currently on cardizem drip.  Room add; no DME equipment needs.  No home health needs.  Denies issues accessing medications or medical care.  Wife will transport to home.   Independent in all ADL's.              Action/Plan:   Expected Discharge Date:  09/01/18               Expected Discharge Plan:  Home/Self Care  In-House Referral:     Discharge planning Services  CM Consult  Post Acute Care Choice:    Choice offered to:     DME Arranged:    DME Agency:     HH Arranged:    HH Agency:     Status of Service:  Completed, signed off  If discussed at [employer] of Stay Meetings, dates discussed:    Additional Comments:  Elza Rafter, RN 08/31/2018, 3:51 PM

## 2018-09-01 ENCOUNTER — Encounter: Payer: Self-pay | Admitting: Internal Medicine

## 2018-09-01 MED ORDER — DILTIAZEM HCL ER COATED BEADS 120 MG PO CP24: 120.0000 mg | ORAL_CAPSULE | Freq: Every day | ORAL | 0 refills | Status: DC

## 2018-09-01 MED ORDER — DILTIAZEM HCL ER COATED BEADS 120 MG PO CP24
120.0000 mg | ORAL_CAPSULE | Freq: Every day | ORAL | Status: DC
  Administered 2018-09-01: 120 mg via ORAL
  Filled 2018-09-01: qty 1

## 2018-09-01 MED ORDER — PREDNISONE 50 MG PO TABS: 50.0000 mg | ORAL_TABLET | Freq: Every day | ORAL | 0 refills | Status: DC

## 2018-09-01 MED ORDER — AMIODARONE HCL 200 MG PO TABS: 200.0000 mg | ORAL_TABLET | Freq: Every day | ORAL | 0 refills | Status: DC

## 2018-09-01 MED ORDER — APIXABAN 5 MG PO TABS: 5.0000 mg | ORAL_TABLET | Freq: Two times a day (BID) | ORAL | 0 refills | Status: DC

## 2018-09-01 MED ORDER — APIXABAN 5 MG PO TABS
5.0000 mg | ORAL_TABLET | Freq: Two times a day (BID) | ORAL | Status: DC
  Administered 2018-09-01: 5 mg via ORAL
  Filled 2018-09-01: qty 1

## 2018-09-01 NOTE — Progress Notes (Signed)
Patient ambulated. Hr went up to 128 once but did not sustain. HR remained mostly around 90's-115, MD notified. Ok to d/c per MD. Will given d/c instructions, remove IV and tele. Family at bedside and will transport patient home.

## 2018-09-01 NOTE — Consult Note (Signed)
ANTICOAGULATION CONSULT NOTE  Pharmacy Consult for apixaban Indication: atrial fibrillation  No Known Allergies  Patient Measurements: Height: 5\' 8"  (172.7 cm) Weight: 160 lb (72.6 kg) IBW/kg (Calculated) : 68.4  Vital Signs: Temp: 97.4 F (36.3 C) (12/31 0749) Temp Source: Oral (12/31 0749) BP: 141/95 (12/31 0749) Pulse Rate: 85 (12/31 0749)  Labs: Recent Labs    08/30/18 0703 08/30/18 1210 08/30/18 1505 08/31/18 0404  HGB 13.9  --   --  13.5  HCT 42.6  --   --  41.3  PLT 284  --   --  257  APTT 36  --   --   --   LABPROT 14.5  --   --   --   INR 1.14  --   --   --   CREATININE 1.35*  --   --  1.12  TROPONINI 0.04* 0.05* 0.05*  --     Estimated Creatinine Clearance: 45.8 mL/min (by C-G formula based on SCr of 1.12 mg/dL).   Medical History: Past Medical History:  Diagnosis Date  . Atrial fibrillation ([place])   . Hypertension    Assessment: 48-yo male from home with wife.  Admitted with supraventricular tachycardia.  New prescription for Eliquis, which he has not taken previously. He has been on Lovenox here at a dose of 40mg  daily with the last dose 12/30 at 2153. He has only one out of 3 qualifiers for a dose reduction.   Goal of Therapy:  Monitor platelets by anticoagulation protocol: Yes   Plan:  Begin apixaban at a dose of 5mg  twice daily  Dallie Piles, PharmD 09/01/2018,8:03 AM

## 2018-09-01 NOTE — Discharge Instructions (Signed)
Resume diet and activity as before ° ° °

## 2018-09-15 NOTE — Discharge Summary (Signed)
[facility] at [place] NAME: Evan Ramirez    MR#:  [id number]  DATE OF BIRTH:  March 21, 1930  DATE OF ADMISSION:  08/30/2018 ADMITTING PHYSICIAN: Loletha Grayer, MD  DATE OF DISCHARGE: 09/01/2018  3:57 PM  PRIMARY CARE PHYSICIAN: Valera Castle, MD   ADMISSION DIAGNOSIS:  Atrial fibrillation with RVR ([place]) [I48.91]  DISCHARGE DIAGNOSIS:  Active Problems:   SVT (supraventricular tachycardia) ([place])   SECONDARY DIAGNOSIS:   Past Medical History:  Diagnosis Date  . Atrial fibrillation ([place])   . Hypertension      ADMITTING HISTORY  HISTORY OF PRESENT ILLNESS:  Evan Ramirez  is a 85 y.o. male with a known history of atrial fibrillation and SVT.  He states that he has been feeling very nauseated recently and his heart has been fluttering for several days.  He has been very weak and no energy.  His blood pressure has been high on the bottom number.  Occasionally gets chest pain but did not describe this very well.  He stated he stopped his metoprolol for 3 days and it did not make a difference on the way he was feeling.  The patient was in SVT, rapid atrial fibrillation on presentation hospitalist services were contacted for further evaluation.  The patient was placed on a Cardizem drip in the emergency room.  HOSPITAL COURSE:   *Atrial fibrillation with rapid ventricular rate *COPD exacerbation *Hypertension *Tobacco use  Patient was started on Cardizem drip.  Cardiology consulted.  Advised changing to oral Cardizem once heart rate improved.  Oral amiodarone ordered. COPD exacerbation was treated with IV steroids, nebulizers.  By the day of discharge his wheezing has resolved.  Shortness of breath back to normal.  Patient discharged home with oral Cardizem and amiodarone to follow-up with the  cardiologist as outpatient  Stable for discharge home  CONSULTS OBTAINED:  Treatment Team:  Yolonda Kida, MD  DRUG ALLERGIES:  No  Known Allergies  DISCHARGE MEDICATIONS:   Allergies as of 09/01/2018   No Known Allergies     Medication List    TAKE these medications   amiodarone 200 MG tablet Commonly known as:  PACERONE Take 1 tablet (200 mg total) by mouth daily.   apixaban 5 MG Tabs tablet Commonly known as:  ELIQUIS Take 1 tablet (5 mg total) by mouth 2 (two) times daily.   aspirin EC 81 MG tablet Take 81 mg by mouth daily.   diltiazem 120 MG 24 hr capsule Commonly known as:  CARDIZEM CD Take 1 capsule (120 mg total) by mouth daily.   lisinopril-hydrochlorothiazide 20-12.5 MG tablet Commonly known as:  PRINZIDE,ZESTORETIC Take 1 tablet by mouth daily.   metoprolol succinate 50 MG 24 hr tablet Commonly known as:  TOPROL-XL Take 50 mg by mouth every evening.   predniSONE 50 MG tablet Commonly known as:  DELTASONE Take 1 tablet (50 mg total) by mouth daily.     ASK your doctor about these medications   magnesium oxide 400 MG tablet Commonly known as:  MAG-OX Take 400 mg by mouth daily. Ask about: Should I take this medication?       Today   VITAL SIGNS:  Blood pressure (!) 141/95, pulse 85, temperature (!) 97.4 F (36.3 C), temperature source Oral, resp. rate 18, height 5\' 8"  (1.727 m), weight 72.6 kg, SpO2 97 %.  I/O:  No intake or output data in the 24 hours ending 09/15/18 1615  PHYSICAL EXAMINATION:  Physical Exam  GENERAL:  85 y.o.-year-old patient lying in the bed with no acute distress.  LUNGS: Normal breath sounds bilaterally, no wheezing, rales,rhonchi or crepitation. No use of accessory muscles of respiration.  CARDIOVASCULAR: S1, S2 normal. No murmurs, rubs, or gallops.  ABDOMEN: Soft, non-tender, non-distended. Bowel sounds present. No organomegaly or mass.  NEUROLOGIC: Moves all 4 extremities. PSYCHIATRIC: The patient is alert and oriented x 3.  SKIN: No obvious rash, lesion, or ulcer.   DATA REVIEW:   CBC No results for input(s): WBC, HGB, HCT, PLT in the last  168 hours.  Chemistries  No results for input(s): NA, K, CL, CO2, GLUCOSE, BUN, CREATININE, CALCIUM, MG, AST, ALT, ALKPHOS, BILITOT in the last 168 hours.  Invalid input(s): GFRCGP  Cardiac Enzymes No results for input(s): TROPONINI in the last 168 hours.  Microbiology Results  Results for orders placed or performed during the hospital encounter of 08/30/18  Respiratory Panel by PCR     Status: None   Collection Time: 08/30/18  1:01 PM  Result Value Ref Range Status   Adenovirus NOT DETECTED NOT DETECTED Final   Coronavirus 229E NOT DETECTED NOT DETECTED Final   Coronavirus HKU1 NOT DETECTED NOT DETECTED Final   Coronavirus NL63 NOT DETECTED NOT DETECTED Final   Coronavirus OC43 NOT DETECTED NOT DETECTED Final   Metapneumovirus NOT DETECTED NOT DETECTED Final   Rhinovirus / Enterovirus NOT DETECTED NOT DETECTED Final   Influenza A NOT DETECTED NOT DETECTED Final   Influenza B NOT DETECTED NOT DETECTED Final   Parainfluenza Virus 1 NOT DETECTED NOT DETECTED Final   Parainfluenza Virus 2 NOT DETECTED NOT DETECTED Final   Parainfluenza Virus 3 NOT DETECTED NOT DETECTED Final   Parainfluenza Virus 4 NOT DETECTED NOT DETECTED Final   Respiratory Syncytial Virus NOT DETECTED NOT DETECTED Final   Bordetella pertussis NOT DETECTED NOT DETECTED Final   Chlamydophila pneumoniae NOT DETECTED NOT DETECTED Final   Mycoplasma pneumoniae NOT DETECTED NOT DETECTED Final    RADIOLOGY:  No results found.  Follow up with PCP in 1 week.  Management plans discussed with the patient, family and they are in agreement.  CODE STATUS:  Code Status History    Date Active Date Inactive Code Status Order ID Comments User Context   08/30/2018 1046 09/01/2018 1903 Full Code [id number]  Loletha Grayer, MD ED      TOTAL TIME TAKING CARE OF THIS PATIENT ON DAY OF DISCHARGE: more than 30 minutes.   Neita Carp M.D on 09/15/2018 at 4:15 PM  Between 7am to 6pm - Pager - [phone]  After 6pm  go to [email or web address] - password [email or web address] [facility]  Office  [phone]  CC: Primary care physician; Valera Castle, MD  Note: This dictation was prepared with Dragon dictation along with smaller phrase technology. Any transcriptional errors that result from this process are unintentional.

## 2020-06-27 ENCOUNTER — Ambulatory Visit: Payer: Self-pay | Admitting: Surgery

## 2020-06-27 NOTE — H&P (View-Only) (Signed)
Subjective:   CC: Non-recurrent unilateral inguinal hernia without obstruction or gangrene [K40.90]  HPI:  Evan Ramirez is a 86 y.o. male who was referred by Valera Castle, MD for evaluation of above. Symptoms were first noted 6 months ago. Asymptomatic. Associated with episodic nausea, bloating, belching, which all resolve over short period with no specific intervention. exacerbated by nothing.  Lump is partially reducible.    Past Medical History:  has a past medical history of BPH (benign prostatic hyperplasia), Hyperlipidemia, and Hypertension.  Past Surgical History:       Past Surgical History:  Procedure Laterality Date  . TYMPANIC MEMBRANE REPAIR      Family History: family history includes High blood pressure (Hypertension) in an other family member; Lung cancer in his brother and brother; Stroke in his father.  Social History:  reports that he has been smoking cigarettes. He started smoking about 64 years ago. He has a 60.00 pack-year smoking history. He has never used smokeless tobacco. He reports that he does not drink alcohol and does not use drugs.  Current Medications: has a current medication list which includes the following prescription(s): amiodarone, amlodipine, eliquis, furosemide, lisinopril-hydrochlorothiazide, and magnesium oxide.  Allergies:     Allergies as of 06/19/2020  . (No Known Allergies)    ROS:  A 15 point review of systems was performed and pertinent positives and negatives noted in HPI   Objective:   BP 159/79   Pulse 71   Ht 171.5 cm (5' 7.5")   Wt 67.6 kg (149 lb)   BMI 22.99 kg/m   Constitutional :  alert, appears stated age, cooperative and no distress  Lymphatics/Throat:  no asymmetry, masses, or scars  Respiratory:  clear to auscultation bilaterally  Cardiovascular:  regular rate and rhythm  Gastrointestinal: soft, non-tender; bowel sounds normal; no masses,  no organomegaly. inguinal hernia noted.  large,  reducible, no overlying skin changes and RIGHT  Musculoskeletal: Steady gait and movement  Skin: Cool and moist,   Psychiatric: Normal affect, non-agitated, not confused       LABS:  n/a   RADS: n/a Assessment:       Non-recurrent unilateral inguinal hernia without obstruction or gangrene [K40.90] RIGHT, likely with bowel involvement  Plan:   1. Non-recurrent unilateral inguinal hernia without obstruction or gangrene [K40.90]   Discussed the risk of surgery including recurrence, which can be up to 50% in the case of incisional or complex hernias, possible use of prosthetic materials (mesh) and the increased risk of mesh infxn if used, bleeding, chronic pain, post-op infxn, post-op SBO or ileus, and possible re-operation to address said risks. The risks of general anesthetic, if used, includes MI, CVA, sudden death or even reaction to anesthetic medications also discussed. Alternatives include continued observation.  Benefits include possible symptom relief, prevention of incarceration, strangulation, enlargement in size over time, and the risk of emergency surgery in the face of strangulation.   Typical post-op recovery time of 3-5 days with 2 weeks of activity restrictions were also discussed.  ED return precautions given for sudden increase in pain, size of hernia with accompanying fever, nausea, and/or vomiting.  The patient verbalized understanding and all questions were answered to the patient's satisfaction.  Likely with intermittent mild obstructive symptoms as noted above.  Still reducible so no emergent surgery needed, but recommended eventual repair despite age and use of eliquis.  Pt will like to think over r/b/a prior to proceeding with surgery.  He will call office back when  ready to schedule.  OPEN APPROACH due to very large size hernia.  Will need to hold eliquis and obtain preop risk stratification prior to surgery as well

## 2020-06-27 NOTE — H&P (Signed)
Subjective:   CC: Non-recurrent unilateral inguinal hernia without obstruction or gangrene [K40.90]  HPI:  Evan Ramirez is a 86 y.o. male who was referred by Valera Castle, MD for evaluation of above. Symptoms were first noted 6 months ago. Asymptomatic. Associated with episodic nausea, bloating, belching, which all resolve over short period with no specific intervention. exacerbated by nothing.  Lump is partially reducible.    Past Medical History:  has a past medical history of BPH (benign prostatic hyperplasia), Hyperlipidemia, and Hypertension.  Past Surgical History:       Past Surgical History:  Procedure Laterality Date  . TYMPANIC MEMBRANE REPAIR      Family History: family history includes High blood pressure (Hypertension) in an other family member; Lung cancer in his brother and brother; Stroke in his father.  Social History:  reports that he has been smoking cigarettes. He started smoking about 64 years ago. He has a 60.00 pack-year smoking history. He has never used smokeless tobacco. He reports that he does not drink alcohol and does not use drugs.  Current Medications: has a current medication list which includes the following prescription(s): amiodarone, amlodipine, eliquis, furosemide, lisinopril-hydrochlorothiazide, and magnesium oxide.  Allergies:     Allergies as of 06/19/2020  . (No Known Allergies)    ROS:  A 15 point review of systems was performed and pertinent positives and negatives noted in HPI   Objective:   BP 159/79   Pulse 71   Ht 171.5 cm (5' 7.5")   Wt 67.6 kg (149 lb)   BMI 22.99 kg/m   Constitutional :  alert, appears stated age, cooperative and no distress  Lymphatics/Throat:  no asymmetry, masses, or scars  Respiratory:  clear to auscultation bilaterally  Cardiovascular:  regular rate and rhythm  Gastrointestinal: soft, non-tender; bowel sounds normal; no masses,  no organomegaly. inguinal hernia noted.  large,  reducible, no overlying skin changes and RIGHT  Musculoskeletal: Steady gait and movement  Skin: Cool and moist,   Psychiatric: Normal affect, non-agitated, not confused       LABS:  n/a   RADS: n/a Assessment:       Non-recurrent unilateral inguinal hernia without obstruction or gangrene [K40.90] RIGHT, likely with bowel involvement  Plan:   1. Non-recurrent unilateral inguinal hernia without obstruction or gangrene [K40.90]   Discussed the risk of surgery including recurrence, which can be up to 50% in the case of incisional or complex hernias, possible use of prosthetic materials (mesh) and the increased risk of mesh infxn if used, bleeding, chronic pain, post-op infxn, post-op SBO or ileus, and possible re-operation to address said risks. The risks of general anesthetic, if used, includes MI, CVA, sudden death or even reaction to anesthetic medications also discussed. Alternatives include continued observation.  Benefits include possible symptom relief, prevention of incarceration, strangulation, enlargement in size over time, and the risk of emergency surgery in the face of strangulation.   Typical post-op recovery time of 3-5 days with 2 weeks of activity restrictions were also discussed.  ED return precautions given for sudden increase in pain, size of hernia with accompanying fever, nausea, and/or vomiting.  The patient verbalized understanding and all questions were answered to the patient's satisfaction.  Likely with intermittent mild obstructive symptoms as noted above.  Still reducible so no emergent surgery needed, but recommended eventual repair despite age and use of eliquis.  Pt will like to think over r/b/a prior to proceeding with surgery.  He will call office back when  ready to schedule.  OPEN APPROACH due to very large size hernia.  Will need to hold eliquis and obtain preop risk stratification prior to surgery as well

## 2020-06-29 ENCOUNTER — Other Ambulatory Visit: Payer: Self-pay

## 2020-06-29 ENCOUNTER — Encounter
Admission: RE | Admit: 2020-06-29 | Discharge: 2020-06-29 | Disposition: A | Discharge status: Home / Self Care | Payer: Medicare Other | Source: Ambulatory Visit | Attending: Surgery | Admitting: Surgery

## 2020-06-29 DIAGNOSIS — Z01818 Encounter for other preprocedural examination: Secondary | ICD-10-CM | POA: Diagnosis present

## 2020-06-29 DIAGNOSIS — I10 Essential (primary) hypertension: Secondary | ICD-10-CM | POA: Insufficient documentation

## 2020-06-29 DIAGNOSIS — I4891 Unspecified atrial fibrillation: Secondary | ICD-10-CM | POA: Diagnosis not present

## 2020-06-29 HISTORY — DX: Rheumatic mitral valve disease, unspecified: I05.9

## 2020-06-29 NOTE — Patient Instructions (Addendum)
Your procedure is scheduled on:07-07-20 FRIDAY Report to Day Surgery on the 2nd floor of the [facility]. To find out your arrival time, please call [phone] between 1PM - 3PM on:07-06-20 THURSDAY  REMEMBER: Instructions that are not followed completely may result in serious medical risk, up to and including death; or upon the discretion of your surgeon and anesthesiologist your surgery may need to be rescheduled.  Do not eat food after midnight the night before surgery.  No gum chewing, lozengers or hard candies.  You may however, drink CLEAR liquids up to 2 hours before you are scheduled to arrive for your surgery. Do not drink anything within 2 hours of your scheduled arrival time.  Clear liquids include: - water  - apple juice without pulp - gatorade (not RED, PURPLE, OR BLUE) - black coffee or tea (Do NOT add milk or creamers to the coffee or tea) Do NOT drink anything that is not on this list.  TAKE THESE MEDICATIONS THE MORNING OF SURGERY WITH A SIP OF WATER: -NORVASC (AMLODIPINE) -AMIODARONE (PACERONE)  Follow recommendations from Cardiologist, Pulmonologist or PCP regarding stopping Aspirin, Coumadin, Plavix, Eliquis, Pradaxa, or Pletal-CALL DR SAKAI'S OFFICE TO SEE WHEN HE WANTS YOU TO STOP YOUR ELIQUIS  One week prior to surgery: Stop Anti-inflammatories (NSAIDS) such as Advil, Aleve, Ibuprofen, Motrin, Naproxen, Naprosyn and Aspirin based products such as Excedrin, Goodys Powder, BC Powder-OK TO TAKE TYLENOL IF NEEDED  Stop ANY OVER THE COUNTER supplements until after surgery. (You may continue taking magnesium and vitamin b 12)  No Alcohol for 24 hours before or after surgery.  No Smoking including e-cigarettes for 24 hours prior to surgery.  No chewable tobacco products for at least 6 hours prior to surgery.  No nicotine patches on the day of surgery.  Do not use any "recreational" drugs for at least a week prior to your surgery.  Please be advised that the  combination of cocaine and anesthesia may have negative outcomes, up to and including death. If you test positive for cocaine, your surgery will be cancelled.  On the morning of surgery brush your teeth with toothpaste and water, you may rinse your mouth with mouthwash if you wish. Do not swallow any toothpaste or mouthwash.  Do not wear jewelry, make-up, hairpins, clips or nail polish.  Do not wear lotions, powders, or perfumes.   Do not shave 48 hours prior to surgery.   Contact lenses, hearing aids and dentures may not be worn into surgery.  Do not bring valuables to the hospital. [facility] is not responsible for any missing/lost belongings or valuables.   Use CHG Soap as directed on instruction sheet.  Notify your doctor if there is any change in your medical condition (cold, fever, infection).  Wear comfortable clothing (specific to your surgery type) to the hospital.  Plan for stool softeners for home use; pain medications have a tendency to cause constipation. You can also help prevent constipation by eating foods high in fiber such as fruits and vegetables and drinking plenty of fluids as your diet allows.  After surgery, you can help prevent lung complications by doing breathing exercises.  Take deep breaths and cough every 1-2 hours. Your doctor may order a device called an Incentive Spirometer to help you take deep breaths. When coughing or sneezing, hold a pillow firmly against your incision with both hands. This is called "splinting." Doing this helps protect your incision. It also decreases belly discomfort.  If you are being admitted  to the hospital overnight, leave your suitcase in the car. After surgery it may be brought to your room.  If you are being discharged the day of surgery, you will not be allowed to drive home. You will need a responsible adult (18 years or older) to drive you home and stay with you that night.   If you are taking public transportation,  you will need to have a responsible adult (18 years or older) with you. Please confirm with your physician that it is acceptable to use public transportation.   Please call the [facility] at [phone] if you have any questions about these instructions.  Visitation Policy:  Patients undergoing a surgery or procedure may have one family member or support person with them as long as that person is not COVID-19 positive or experiencing its symptoms.  That person may remain in the waiting area during the procedure.  Inpatient Visitation Update:   In an effort to ensure the safety of our team members and our patients, we are implementing a change to our visitation policy:  Effective Monday, Aug. 9, at 7 a.m., inpatients will be allowed one support person.  o The support person may change daily.  o The support person must pass our screening, gel in and out, and wear a mask at all times, including in the patient's room.  o Patients must also wear a mask when staff or their support person are in the room.  o Masking is required regardless of vaccination status.  Systemwide, no visitors 17 or younger.

## 2020-06-29 NOTE — Pre-Procedure Instructions (Signed)
Miscellaneous Notes - documented in this encounter  Progress Notes - Virtual - Fath, Aloha Gell, MD - 01/29/2019 12:00 PM EDT Formatting of this note is different from the original. During this declared [employer] and Covid-19 pandemic, the patient has requested to obtain treatment via telehealth. Both patient and care provider will, using this treatment method, endeavor to provide care and treatment that cannot be done in office. Under these emergent conditions it is the best (or only) means of connecting. The patient has been advised of the potential risks and limitations of this mode of treatment (including, but not limited to, the absence of in-person examination) and has agreed to be treated in a remote fashion in spite of them. Any and all of the patient's/patient's family's questions on this issue have been answered, and I have made no promises or guarantees to the patient. The patient has also been advised to contact this office for worsening conditions or problems, and seek medical treatment and/or call 911 if the patient deems either necessary."   Telemedicine telephone encounter documentation  This telephone encounter was conducted with the patient's (or proxy's) verbal consent via audio telecommunications.  Patient was instructed to have this encounter in a suitably private space; and to only have persons present to whom they give permission to participate. In addition, patient identity was confirmed by use of name plus two identifiers.  I spent a total of 21 minutes with the patient over half of which was spent counseling and/or coordinating care related to the patient's condition.   Chief Complaint:   Chief Complaint  Patient presents with  . Hypertension  . Palpitations  . Atrial Fibrillation   Subjective:   Evan Ramirez is a 86 y.o. male established patient. Telephone visit today for: Discussion regarding the patient's history of atrial fibrillation which is been  paroxysmal, history of hypertension and mitral valve disease. Patient denies any shortness of breath. Since being placed on amiodarone, the patient has had fewer episodes of breakthrough arrhythmias. His blood pressure has been well controlled with amlodipine at 5 mg daily and lisinopril hydrochlorothiazide at 20-12.5 mg daily. He denies syncope or presyncope. He denies any excessive bleeding or bruising.  HPI  Patient Active Problem List  Diagnosis  . Essential hypertension, benign  . Mixed hyperlipidemia  . History of hepatitis  . Palpitations  . BPH (benign prostatic hyperplasia)  . Mitral valve disease  . Paroxysmal atrial fibrillation (CMS-HCC)   Outpatient Medications Prior to Visit  Medication Sig Dispense Refill  . AMIOdarone (PACERONE) 200 MG tablet Take 1 tablet (200 mg total) by mouth once daily 60 tablet 6  . amLODIPine (NORVASC) 5 MG tablet Take 1 tablet (5 mg total) by mouth once daily 30 tablet 11  . apixaban (ELIQUIS) 5 mg tablet Take 1 tablet (5 mg total) by mouth every 12 (twelve) hours 60 tablet 5  . FUROsemide (LASIX) 40 MG tablet Take 1 tablet (40 mg total) by mouth once daily (Patient taking differently: Take 40 mg by mouth once daily as needed ) 30 tablet 0  . lisinopriL-hydrochlorothiazide (ZESTORETIC) 20-12.5 mg tablet Take 2 tablets by mouth once daily 60 tablet 6  . magnesium oxide (MAG-OX) 400 mg (241.3 mg magnesium) tablet TAKE 1 TABLET BY MOUTH ONCE DAILY 30 tablet 11   No facility-administered medications prior to visit.   No Known Allergies  Social History   Socioeconomic History  . Marital status: Married  Spouse name: Not on file  . Number of children: Not  on file  . Years of education: Not on file  . Highest education level: Not on file  Occupational History  . Not on file  Social Needs  . Financial resource strain: Not on file  . Food insecurity:  Worry: Not on file  Inability: Not on file  . Transportation needs:  Medical: Not on file   Non-medical: Not on file  Tobacco Use  . Smoking status: Current Every Day Smoker  Packs/day: 1.00  Years: 60.00  Pack years: 60.00  Types: Cigarettes  Start date: 05/21/1956  . Smokeless tobacco: Never Used  Substance and Sexual Activity  . Alcohol use: No  . Drug use: No  . Sexual activity: Not Currently  Lifestyle  . Physical activity:  Days per week: Not on file  Minutes per session: Not on file  . Stress: Not on file  Relationships  . Social connections:  Talks on phone: Not on file  Gets together: Not on file  Attends religious service: Not on file  Active member of club or organization: Not on file  Attends meetings of clubs or organizations: Not on file  Relationship status: Not on file  Other Topics Concern  . Not on file  Social History Narrative  . Not on file   ROS  See HPI for other ROS  Objective:   Vitals as reported by patient: Vitals:  01/29/19 1041  BP: 123/70  Pulse: 74   There is no height or weight on file to calculate BMI. Patient sounds well today, in no distress , no audible wheezing, speaking in full sentences  Assessment/Plan:   Diagnoses and all orders for this visit:  Essential hypertension, benign-blood pressure has been in good control at home on current regimen. He is attempting to eat a low-sodium diet.  Mitral valve disease-no clinical evidence of heart failure. We will do a physical exam when next visit in person to better evaluate the valve.  Mixed hyperlipidemia-continue with low-fat low-cholesterol diet. Does not appear to require a statin at present given age.  Paroxysmal atrial fibrillation (CMS-HCC)-improved paroxysmal A. fib on amiodarone. No evidence of toxicity clinically. We will continue with amiodarone 200 mg daily and apixaban 5 mg twice daily.  Palpitations-as per above. We will continue with magnesium as well to help with palpitations.  Return in about 3 months (around 05/01/2019).  Future Appointments   Date/Time Provider [place] Visit Type  01/29/2019 12:00 PM Fath, Aloha Gell, MD [facility] C TELEPHONE VISIT [facility])    Patient Instructions  Continue with current medications.     Electronically signed by Sydnee Levans, MD at 01/29/2019 10:53 AM EDT  Plan of Treatment - documented as of this encounter Upcoming Encounters Upcoming Encounters  Date Type Specialty Care Team Description  07/18/2020 [place], [place], [place]  [place], [place] [address]  [phone] (Work)  [phone] (Fax)    Visit Diagnoses - documented in this encounter Diagnosis  Essential hypertension, benign - Primary   Mitral valve disease  Other and unspecified mitral valve diseases   Mixed hyperlipidemia   Paroxysmal atrial fibrillation (CMS-HCC)  Atrial fibrillation   Palpitations   Care Teams - documented as of this encounter Team Member Relationship Specialty Start Date End Date  Valera Castle, MD  [address]  [place], [place] [address]  [phone] (Work)  [phone] (Fax)  PCP - General  06/11/11   Images Patient Demographics  Patient Address Communication Language Race / Ethnicity  Marital Status  [facility]) [place], [place] [address]  Former (Jan 18, 2011 - Jan. 11, 2017): [facility] [place], [place] [address] [phone] [facility]) [phone] (Mobile) English (Preferred) Dema Severin / Not Hispanic or Latino Married  Patient Contacts  Contact Name Contact Address Communication Relationship to Patient  Abijah Roussel [address] [place], [place] [address] [phone] [facility]) Spouse, Emergency Contact  Document Information  Primary Care Provider Other Service Providers Document Coverage Dates  Valera Castle, MD (Oct. 09, 2012October 09, 2012 - Present) [phone] (Work) [phone] (Fax) [place], [place] [address] Family  Medicine [facility] [address] [place], [place] [address]  May 29, 2020May 29, 2020 - Jun. 10, 2020June 10, [place] [address] [place], [place] [address]   Encounter Providers Encounter Date  Sydnee Levans, MD (Attending) [phone] (Work) [phone] (Fax) [address] [place], [place] [address] Cardiovascular Disease May 29, 2020May 29, 2020 - Jun. 10, 2020June 10, 2020   Legal Authenticator   Daneil Dan    Show All Sections

## 2020-06-29 NOTE — Pre-Procedure Instructions (Signed)
Care Everywhere Labs History Expand AllCollapse All CHEM [place] 06/14/2020 Component    Sodium  Potassium  Chloride  Carbon Dioxide (CO2)  Urea Nitrogen (BUN)  Creatinine  Glucose  Calcium  Anion Gap  BUN/CREA Ratio  Glomerular Filtration Rate (eGFR), MDRD Estimate  GFR(MDRD)  Bilirubin, Conjugated  Protein, Total  Albumin  Bilirubin, Total  Alkaline Phosphatase  AST  ALT  Carbon Dioxide  Urea Nitrogen  Glomerular Filtration Rate (eGFR)   AST (Aspartate Aminotransferase)  ALT (Alanine Aminotransferase)  Alk Phos (Alkaline Phosphatase)  Component 06/14/2020 09/09/2018 06/02/2018         Sodium 138 132Low   141 Load older lab results  Potassium 3.8 4.0 4.2 Load older lab results  Chloride 102 98 103 Load older lab results  Carbon Dioxide (CO2) '27 25 28 ' Load older lab results  Urea Nitrogen (BUN) '13 17 10 ' Load older lab results  Creatinine 1.2 1.1 1.0 Load older lab results  Glucose 80 101 112 Load older lab results  Calcium 9.1 8.9 9.7 Load older lab results  Anion Gap '9 9 10 ' Load older lab results  BUN/CREA Ratio '11 16 10 ' Load older lab results  Glomerular Filtration Rate (eGFR), MDRD Estimate -- -- -- Load older lab results  GFR(MDRD) -- -- -- Load older lab results  Bilirubin, Conjugated -- -- -- Load older lab results  Protein, Total 7.1 6.2 7.3 Load older lab results  Albumin 3.8 3.6 3.9 Load older lab results  Bilirubin, Total 0.6 1.3 0.6 Load older lab results  Alkaline Phosphatase -- -- -- Load older lab results  AST -- -- -- Load older lab results  ALT -- -- -- Load older lab results  Carbon Dioxide -- -- -- Load older lab results  Urea Nitrogen -- -- -- Load older lab results  Glomerular Filtration Rate (eGFR)  54 60 68 Load older lab results  AST (Aspartate Aminotransferase) '17 22 21   ' ALT (Alanine Aminotransferase) 13Low   36 14Low     Alk Phos (Alkaline Phosphatase) 119High   98 [place] 06/14/2020 Component    Glucose  Hemoglobin A1C  Average Blood Glucose (Calculated From HgBA1c Level)  Component 06/14/2020 06/14/2020 09/09/2018 09/09/2018 06/02/2018           Glucose 80 -- 101 -- 112 Load older lab results  Hemoglobin A1C -- 5.6 -- 6.2 --   Average Blood Glucose (Calculated From HgBA1c Level) -- 111 -- 130

## 2020-06-30 ENCOUNTER — Encounter
Admission: RE | Admit: 2020-06-30 | Discharge: 2020-06-30 | Disposition: A | Discharge status: Home / Self Care | Payer: Medicare Other | Source: Ambulatory Visit | Attending: Surgery | Admitting: Surgery

## 2020-06-30 DIAGNOSIS — Z01818 Encounter for other preprocedural examination: Secondary | ICD-10-CM | POA: Diagnosis not present

## 2020-06-30 DIAGNOSIS — Z01810 Encounter for preprocedural cardiovascular examination: Secondary | ICD-10-CM | POA: Diagnosis not present

## 2020-06-30 LAB — CBC
HCT: 37.6 % — ABNORMAL LOW (ref 39.0–52.0)
Hemoglobin: 12.8 g/dL — ABNORMAL LOW (ref 13.0–17.0)
MCH: 31.4 pg (ref 26.0–34.0)
MCHC: 34 g/dL (ref 30.0–36.0)
MCV: 92.4 fL (ref 80.0–100.0)
Platelets: 310 10*3/uL (ref 150–400)
RBC: 4.07 MIL/uL — ABNORMAL LOW (ref 4.22–5.81)
RDW: 13.2 % (ref 11.5–15.5)
WBC: 6.9 10*3/uL (ref 4.0–10.5)
nRBC: 0 % (ref 0.0–0.2)

## 2020-07-05 ENCOUNTER — Other Ambulatory Visit
Admission: RE | Admit: 2020-07-05 | Discharge: 2020-07-05 | Disposition: A | Discharge status: Home / Self Care | Payer: Medicare Other | Source: Ambulatory Visit | Attending: Surgery | Admitting: Surgery

## 2020-07-05 ENCOUNTER — Other Ambulatory Visit: Payer: Self-pay

## 2020-07-05 DIAGNOSIS — Z20822 Contact with and (suspected) exposure to covid-19: Secondary | ICD-10-CM | POA: Insufficient documentation

## 2020-07-05 DIAGNOSIS — Z01812 Encounter for preprocedural laboratory examination: Secondary | ICD-10-CM | POA: Insufficient documentation

## 2020-07-05 LAB — SARS CORONAVIRUS 2 (TAT 6-24 HRS): SARS Coronavirus 2: NEGATIVE

## 2020-07-06 MED ORDER — LACTATED RINGERS IV SOLN: INTRAVENOUS | Status: DC

## 2020-07-06 MED ORDER — CELECOXIB 200 MG PO CAPS: 200.0000 mg | ORAL_CAPSULE | ORAL | Status: AC

## 2020-07-06 MED ORDER — GABAPENTIN 300 MG PO CAPS: 300.0000 mg | ORAL_CAPSULE | ORAL | Status: AC

## 2020-07-06 MED ORDER — ORAL CARE MOUTH RINSE: 15.0000 mL | Freq: Once | OROMUCOSAL | Status: AC

## 2020-07-06 MED ORDER — ACETAMINOPHEN 500 MG PO TABS: 1000.0000 mg | ORAL_TABLET | ORAL | Status: AC

## 2020-07-06 MED ORDER — CEFAZOLIN SODIUM-DEXTROSE 2-4 GM/100ML-% IV SOLN
2.0000 g | INTRAVENOUS | Status: AC
  Administered 2020-07-07: 2 g via INTRAVENOUS

## 2020-07-06 MED ORDER — CHLORHEXIDINE GLUCONATE 0.12 % MT SOLN: 15.0000 mL | Freq: Once | OROMUCOSAL | Status: AC

## 2020-07-07 ENCOUNTER — Encounter
Admission: RE | Disposition: A | Discharge status: Home / Self Care | Payer: Self-pay | Source: Home / Self Care | Attending: Surgery

## 2020-07-07 ENCOUNTER — Ambulatory Visit: Payer: Medicare Other | Admitting: Anesthesiology

## 2020-07-07 ENCOUNTER — Ambulatory Visit
Admission: RE | Admit: 2020-07-07 | Discharge: 2020-07-07 | Disposition: A | Discharge status: Home / Self Care | Payer: Medicare Other | Attending: Surgery | Admitting: Surgery

## 2020-07-07 ENCOUNTER — Other Ambulatory Visit: Payer: Self-pay

## 2020-07-07 ENCOUNTER — Encounter: Payer: Self-pay | Admitting: Surgery

## 2020-07-07 DIAGNOSIS — F17210 Nicotine dependence, cigarettes, uncomplicated: Secondary | ICD-10-CM | POA: Diagnosis not present

## 2020-07-07 DIAGNOSIS — D176 Benign lipomatous neoplasm of spermatic cord: Secondary | ICD-10-CM | POA: Diagnosis not present

## 2020-07-07 DIAGNOSIS — Z79899 Other long term (current) drug therapy: Secondary | ICD-10-CM | POA: Insufficient documentation

## 2020-07-07 DIAGNOSIS — Z7901 Long term (current) use of anticoagulants: Secondary | ICD-10-CM | POA: Diagnosis not present

## 2020-07-07 DIAGNOSIS — K4090 Unilateral inguinal hernia, without obstruction or gangrene, not specified as recurrent: Secondary | ICD-10-CM | POA: Insufficient documentation

## 2020-07-07 HISTORY — PX: INGUINAL HERNIA REPAIR: SHX194

## 2020-07-07 SURGERY — REPAIR, HERNIA, INGUINAL, ADULT
Anesthesia: General | Site: Groin | Laterality: Right

## 2020-07-07 MED ORDER — GABAPENTIN 300 MG PO CAPS
ORAL_CAPSULE | ORAL | Status: AC
  Administered 2020-07-07: 300 mg via ORAL
  Filled 2020-07-07: qty 1

## 2020-07-07 MED ORDER — DEXAMETHASONE SODIUM PHOSPHATE 10 MG/ML IJ SOLN
INTRAMUSCULAR | Status: DC | PRN
  Administered 2020-07-07: 10 mg via INTRAVENOUS

## 2020-07-07 MED ORDER — BUPIVACAINE LIPOSOME 1.3 % IJ SUSP
INTRAMUSCULAR | Status: AC
  Filled 2020-07-07: qty 20

## 2020-07-07 MED ORDER — CHLORHEXIDINE GLUCONATE CLOTH 2 % EX PADS: 6.0000 | MEDICATED_PAD | Freq: Once | CUTANEOUS | Status: DC

## 2020-07-07 MED ORDER — CELECOXIB 200 MG PO CAPS
ORAL_CAPSULE | ORAL | Status: AC
  Administered 2020-07-07: 200 mg via ORAL
  Filled 2020-07-07: qty 1

## 2020-07-07 MED ORDER — ACETAMINOPHEN 325 MG PO TABS: 650.0000 mg | ORAL_TABLET | Freq: Three times a day (TID) | ORAL | 0 refills | Status: AC | PRN

## 2020-07-07 MED ORDER — FENTANYL CITRATE (PF) 100 MCG/2ML IJ SOLN: 25.0000 ug | INTRAMUSCULAR | Status: DC | PRN

## 2020-07-07 MED ORDER — DEXAMETHASONE SODIUM PHOSPHATE 10 MG/ML IJ SOLN
INTRAMUSCULAR | Status: AC
  Filled 2020-07-07: qty 1

## 2020-07-07 MED ORDER — PHENYLEPHRINE HCL (PRESSORS) 10 MG/ML IV SOLN
INTRAVENOUS | Status: AC
  Filled 2020-07-07: qty 1

## 2020-07-07 MED ORDER — BUPIVACAINE-EPINEPHRINE (PF) 0.5% -1:200000 IJ SOLN
INTRAMUSCULAR | Status: DC | PRN
  Administered 2020-07-07: 5 mL via PERINEURAL

## 2020-07-07 MED ORDER — HYDROCODONE-ACETAMINOPHEN 5-325 MG PO TABS: 1.0000 | ORAL_TABLET | Freq: Four times a day (QID) | ORAL | 0 refills | Status: DC | PRN

## 2020-07-07 MED ORDER — HYDROCODONE-ACETAMINOPHEN 5-325 MG PO TABS: 1.0000 | ORAL_TABLET | Freq: Once | ORAL | Status: AC

## 2020-07-07 MED ORDER — IBUPROFEN 800 MG PO TABS: 800.0000 mg | ORAL_TABLET | Freq: Three times a day (TID) | ORAL | 0 refills | Status: DC | PRN

## 2020-07-07 MED ORDER — CHLORHEXIDINE GLUCONATE 0.12 % MT SOLN
OROMUCOSAL | Status: AC
  Administered 2020-07-07: 15 mL via OROMUCOSAL
  Filled 2020-07-07: qty 15

## 2020-07-07 MED ORDER — FAMOTIDINE 20 MG PO TABS
ORAL_TABLET | ORAL | Status: AC
  Administered 2020-07-07: 20 mg via ORAL
  Filled 2020-07-07: qty 1

## 2020-07-07 MED ORDER — FENTANYL CITRATE (PF) 100 MCG/2ML IJ SOLN
INTRAMUSCULAR | Status: AC
  Filled 2020-07-07: qty 2

## 2020-07-07 MED ORDER — LIDOCAINE HCL (PF) 2 % IJ SOLN
INTRAMUSCULAR | Status: AC
  Filled 2020-07-07: qty 5

## 2020-07-07 MED ORDER — EPHEDRINE SULFATE 50 MG/ML IJ SOLN
INTRAMUSCULAR | Status: DC | PRN
  Administered 2020-07-07 (×2): 5 mg via INTRAVENOUS
  Administered 2020-07-07: 10 mg via INTRAVENOUS
  Administered 2020-07-07: 5 mg via INTRAVENOUS

## 2020-07-07 MED ORDER — ONDANSETRON HCL 4 MG/2ML IJ SOLN
INTRAMUSCULAR | Status: AC
  Filled 2020-07-07: qty 2

## 2020-07-07 MED ORDER — ACETAMINOPHEN 500 MG PO TABS
ORAL_TABLET | ORAL | Status: AC
  Administered 2020-07-07: 1000 mg via ORAL
  Filled 2020-07-07: qty 2

## 2020-07-07 MED ORDER — LIDOCAINE HCL (CARDIAC) PF 100 MG/5ML IV SOSY
PREFILLED_SYRINGE | INTRAVENOUS | Status: DC | PRN
  Administered 2020-07-07: 100 mg via INTRAVENOUS

## 2020-07-07 MED ORDER — ONDANSETRON HCL 4 MG/2ML IJ SOLN
INTRAMUSCULAR | Status: DC | PRN
  Administered 2020-07-07: 4 mg via INTRAVENOUS

## 2020-07-07 MED ORDER — PROPOFOL 10 MG/ML IV BOLUS
INTRAVENOUS | Status: AC
  Filled 2020-07-07: qty 20

## 2020-07-07 MED ORDER — BUPIVACAINE LIPOSOME 1.3 % IJ SUSP
INTRAMUSCULAR | Status: DC | PRN
  Administered 2020-07-07: 20 mL

## 2020-07-07 MED ORDER — BUPIVACAINE-EPINEPHRINE (PF) 0.5% -1:200000 IJ SOLN
INTRAMUSCULAR | Status: AC
  Filled 2020-07-07: qty 30

## 2020-07-07 MED ORDER — CEFAZOLIN SODIUM-DEXTROSE 2-4 GM/100ML-% IV SOLN
INTRAVENOUS | Status: AC
  Filled 2020-07-07: qty 100

## 2020-07-07 MED ORDER — DOCUSATE SODIUM 100 MG PO CAPS: 100.0000 mg | ORAL_CAPSULE | Freq: Two times a day (BID) | ORAL | 0 refills | Status: AC | PRN

## 2020-07-07 MED ORDER — FAMOTIDINE 20 MG PO TABS: 20.0000 mg | ORAL_TABLET | Freq: Once | ORAL | Status: AC

## 2020-07-07 MED ORDER — HYDROCODONE-ACETAMINOPHEN 5-325 MG PO TABS
ORAL_TABLET | ORAL | Status: AC
  Administered 2020-07-07: 1 via ORAL
  Filled 2020-07-07: qty 1

## 2020-07-07 MED ORDER — ONDANSETRON HCL 4 MG/2ML IJ SOLN: 4.0000 mg | Freq: Once | INTRAMUSCULAR | Status: DC | PRN

## 2020-07-07 MED ORDER — FENTANYL CITRATE (PF) 100 MCG/2ML IJ SOLN
INTRAMUSCULAR | Status: DC | PRN
  Administered 2020-07-07 (×3): 25 ug via INTRAVENOUS

## 2020-07-07 MED ORDER — PROPOFOL 10 MG/ML IV BOLUS
INTRAVENOUS | Status: DC | PRN
  Administered 2020-07-07: 120 mg via INTRAVENOUS
  Administered 2020-07-07: 80 mg via INTRAVENOUS

## 2020-07-07 SURGICAL SUPPLY — 39 items
ADH SKN CLS APL DERMABOND .7 (GAUZE/BANDAGES/DRESSINGS) ×1
APL PRP STRL LF DISP 70% ISPRP (MISCELLANEOUS) ×1
BLADE CLIPPER SURG (BLADE) ×3 IMPLANT
BLADE SURG 15 STRL LF DISP TIS (BLADE) ×1 IMPLANT
BLADE SURG 15 STRL SS (BLADE) ×3
CANISTER SUCT 1200ML W/VALVE (MISCELLANEOUS) ×3 IMPLANT
CHLORAPREP W/TINT 26 (MISCELLANEOUS) ×3 IMPLANT
COVER WAND RF STERILE (DRAPES) ×3 IMPLANT
DERMABOND ADVANCED (GAUZE/BANDAGES/DRESSINGS) ×2
DERMABOND ADVANCED .7 DNX12 (GAUZE/BANDAGES/DRESSINGS) ×1 IMPLANT
DRAIN PENROSE 1/4X12 LTX STRL (WOUND CARE) ×3 IMPLANT
DRAPE LAPAROTOMY 100X77 ABD (DRAPES) ×3 IMPLANT
ELECT REM PT RETURN 9FT ADLT (ELECTROSURGICAL) ×3
ELECTRODE REM PT RTRN 9FT ADLT (ELECTROSURGICAL) ×1 IMPLANT
GLOVE BIOGEL PI IND STRL 7.0 (GLOVE) ×1 IMPLANT
GLOVE BIOGEL PI INDICATOR 7.0 (GLOVE) ×2
GLOVE SURG SYN 6.5 ES PF (GLOVE) ×6 IMPLANT
GOWN STRL REUS W/ TWL LRG LVL3 (GOWN DISPOSABLE) ×2 IMPLANT
GOWN STRL REUS W/TWL LRG LVL3 (GOWN DISPOSABLE) ×6
LABEL OR SOLS (LABEL) ×3 IMPLANT
MANIFOLD NEPTUNE II (INSTRUMENTS) ×3 IMPLANT
MESH PARIETEX PROGRIP RIGHT (Mesh General) ×3 IMPLANT
NEEDLE HYPO 22GX1.5 SAFETY (NEEDLE) ×6 IMPLANT
NS IRRIG 500ML POUR BTL (IV SOLUTION) ×3 IMPLANT
PACK BASIN MINOR (MISCELLANEOUS) ×3 IMPLANT
SUT ETHIBOND NAB MO 7 #0 18IN (SUTURE) ×3 IMPLANT
SUT MNCRL 4-0 (SUTURE) ×3
SUT MNCRL 4-0 27XMFL (SUTURE) ×1
SUT SILK 3 0 12 30 (SUTURE) IMPLANT
SUT SILK 3 0 SH 30 (SUTURE) IMPLANT
SUT VIC AB 2-0 CT2 27 (SUTURE) ×3 IMPLANT
SUT VIC AB 3-0 SH 27 (SUTURE) ×3
SUT VIC AB 3-0 SH 27X BRD (SUTURE) ×1 IMPLANT
SUTURE MNCRL 4-0 27XMF (SUTURE) ×1 IMPLANT
SYR 10ML LL (SYRINGE) ×3 IMPLANT
SYR 20ML LL LF (SYRINGE) ×3 IMPLANT
SYR BULB IRRIG 60ML STRL (SYRINGE) ×3 IMPLANT
TOWEL OR 17X26 4PK STRL BLUE (TOWEL DISPOSABLE) ×3 IMPLANT
WATER STERILE IRR 1000ML POUR (IV SOLUTION) ×3 IMPLANT

## 2020-07-07 NOTE — Transfer of Care (Signed)
Immediate Anesthesia Transfer of Care Note  Patient: Evan Ramirez  Procedure(s) Performed: HERNIA REPAIR INGUINAL ADULT (Right Groin)  Patient Location: PACU  Anesthesia Type:General  Level of Consciousness: sedated  Airway & Oxygen Therapy: Patient connected to face mask oxygen  Post-op Assessment: Post -op Vital signs reviewed and stable  Post vital signs: stable  Last Vitals:  Vitals Value Taken Time  BP 116/54   Temp    Pulse 66 07/07/20 0924  Resp 10 07/07/20 0924  SpO2 100 % 07/07/20 0924  Vitals shown include unvalidated device data.  Last Pain:  Vitals:   07/07/20 0628  TempSrc: Oral  PainSc: 0-No pain         Complications: No complications documented.

## 2020-07-07 NOTE — Progress Notes (Signed)
Called Dr. Lysle Pearl to tell him the patient is unable to urinate and that he has 344cc of urine in his bladder and he stated to insert a catheter and alert the patient to make an appointment with [facility] to find out what maybe causing the bladder to not wake up. I will place the catheter and and tell the family to contact [facility] to remove foley catheter and perform a voiding trial.

## 2020-07-07 NOTE — Progress Notes (Addendum)
   07/07/20 0740  Clinical Encounter Type  Visited With Family  Visit Type Initial  Referral From Chaplain  Consult/Referral To Chaplain  This note was written on 07/07/2020.  While rounding [facility] waiting area, chaplain briefly visited with Pt's wife, Mariann Laster to find out how she was doing while waiting. She said that she was fine and didn't have any questions.

## 2020-07-07 NOTE — Interval H&P Note (Signed)
History and Physical Interval Note:  07/07/2020 7:15 AM  Evan Ramirez  has presented today for surgery, with the diagnosis of K40.90 non recurrent unilateral inguinal hernia w/o obstruction or gangrene.  The various methods of treatment have been discussed with the patient and family. After consideration of risks, benefits and other options for treatment, the patient has consented to  Procedure(s): HERNIA REPAIR INGUINAL ADULT (Right) as a surgical intervention.  The patient's history has been reviewed, patient examined, no change in status, stable for surgery.  I have reviewed the patient's chart and labs.  Questions were answered to the patient's satisfaction.     Tosha Belgarde Lysle Pearl

## 2020-07-07 NOTE — Op Note (Signed)
Preoperative diagnosis: right Inguinal Hernia, reducible  Postoperative diagnosis: right indirect  Inguinal Hernia  Procedure:  Open right Inguinal hernia repair with mesh  Anesthesia: General, LMA  Surgeon: Dr. Lysle Pearl  Wound Classification: Clean  Specimen: none  Complications: None  Estimated Blood Loss: 50mL   Indications:  Patient is a 86 y.o. male developed a symptomatic right indirect inguinal hernia. Repair was indicated to avoid complications of incarceration, obstruction and pain, and a prosthetic mesh repair was elected.  Findings: 1. Vas Deferens and cord structures identified and preserved 2. Progrip mesh used for repair 3. Adequate hemostasis achieved  Description of procedure: The patient was taken to the operating room. A time-out was completed verifying correct patient, procedure, site, positioning, and implant(s) and/or special equipment prior to beginning this procedure. The right groin was prepped and draped in the usual sterile fashion. An incision was marked in a natural skin crease and planned to end near the pubic tubercle.  The skin crease incision was made with a knife and deepened through Scarpa's and Camper's fascia with electrocautery until the aponeurosis of the external oblique was encountered. This was cleaned and the external ring was exposed. Hemostasis was achieved in the wound. An incision was made in the midportion of the external oblique aponeurosis in the direction of its fibers. The ilioinguinal nerve was identified and protected throughout the dissection. Flaps of the external oblique were developed cephalad and inferiorly.  The cord was identified. It was gently dissected free at the pubic tubercle and encircled with a Penrose drain. Attention was directed to the anteromedial aspect of the cord, where a large indirect hernia sac was identified. The sac was carefully dissected free of the cord down to the level of the internal ring and suture ligated  with 3-0 silk, with stump reduced back into abdominal cavity along with an adjacent cord lipoma. The vas and testicular vessels were identified and protected from harm.   Attention then turned to the floor of the canal, which was intact. The Progrip mesh was inserted and secured to the pubic tubercle using interrupted 0 ethibond sutures. Care was taken to assure that the mesh was placed flat against the floor and wrapped loosely around the cord structures.  Hemostasis was again checked. The Penrose drain was removed. Exparel infused as an ilioinguinal block.  The external oblique aponeurosis was closed with a running suture of 2-0 Vicryl, taking care not to catch the ilioinguinal nerve in the suture line. Scarpa's fascia was closed with interrupted 3-0 Vicryl.  The deep dermal layer closed with interrupted 3-0 Vicryl.  The skin was closed with a subcuticular stitch of Monocryl 4-0. Dermabond was applied.  The testis was gently pulled down into its anatomic position in the scrotum.  The patient tolerated the procedure well and was taken to the postanesthesia care unit in stable condition. Sponge and instrument count correct at end of procedure.

## 2020-07-07 NOTE — Progress Notes (Signed)
Called Dr. Lysle Pearl told him the patient could not urinate so we bladder scanned him and got 602 and he ordered an in and out and he stated to give the patient more fluids and let him drink so he can try to urinate again.

## 2020-07-07 NOTE — Progress Notes (Signed)
Bladder scanned patient after 40cc of urine and he felt like he did not have anymore urine but patient had >520 in his bladder so I called Dr. Lysle Pearl and he stated wait one hour to see if he can go. I will monitor the patient until then.

## 2020-07-07 NOTE — Anesthesia Procedure Notes (Signed)
Procedure Name: LMA Insertion Date/Time: 07/07/2020 7:34 AM Performed by: Aline Brochure, CRNA Pre-anesthesia Checklist: Patient identified, Patient being monitored, Timeout performed, Emergency Drugs available and Suction available Patient Re-evaluated:Patient Re-evaluated prior to induction Oxygen Delivery Method: Circle system utilized Preoxygenation: Pre-oxygenation with 100% oxygen Induction Type: IV induction Ventilation: Mask ventilation without difficulty LMA: LMA inserted LMA Size: 4.5 Tube type: Oral Number of attempts: 1 Placement Confirmation: positive ETCO2 and breath sounds checked- equal and bilateral Tube secured with: Tape Dental Injury: Teeth and Oropharynx as per pre-operative assessment

## 2020-07-07 NOTE — Anesthesia Preprocedure Evaluation (Signed)
Anesthesia Evaluation  Patient identified by MRN, date of birth, ID band Patient awake    Reviewed: Allergy & Precautions, H&P , NPO status , Patient's Chart, lab work & pertinent test results, reviewed documented beta blocker date and time   History of Anesthesia Complications Negative for: history of anesthetic complications  Airway Mallampati: II  TM Distance: >3 FB Neck ROM: full    Dental  (+) Dental Advidsory Given, Edentulous Upper, Edentulous Lower   Pulmonary neg shortness of breath, neg COPD, neg recent URI, Current Smoker and Patient abstained from smoking.,    Pulmonary exam normal breath sounds clear to auscultation       Cardiovascular Exercise Tolerance: Good hypertension, (-) angina(-) Past MI and (-) Cardiac Stents + dysrhythmias Atrial Fibrillation + Valvular Problems/Murmurs  Rhythm:regular Rate:Normal     Neuro/Psych negative neurological ROS  negative psych ROS   GI/Hepatic negative GI ROS, Neg liver ROS,   Endo/Other  negative endocrine ROS  Renal/GU negative Renal ROS  negative genitourinary   Musculoskeletal   Abdominal   Peds  Hematology negative hematology ROS (+)   Anesthesia Other Findings Past Medical History: No date: Atrial fibrillation (HCC) No date: Hypertension No date: Mitral valve disease   Reproductive/Obstetrics negative OB ROS                             Anesthesia Physical Anesthesia Plan  ASA: II  Anesthesia Plan: General   Post-op Pain Management:    Induction: Intravenous  PONV Risk Score and Plan: 1 and Ondansetron, Dexamethasone and Treatment may vary due to age or medical condition  Airway Management Planned: LMA and Oral ETT  Additional Equipment:   Intra-op Plan:   Post-operative Plan: Extubation in OR  Informed Consent: I have reviewed the patients History and Physical, chart, labs and discussed the procedure including the  risks, benefits and alternatives for the proposed anesthesia with the patient or authorized representative who has indicated his/her understanding and acceptance.     Dental Advisory Given  Plan Discussed with: Anesthesiologist, CRNA and Surgeon  Anesthesia Plan Comments:         Anesthesia Quick Evaluation

## 2020-07-07 NOTE — Discharge Instructions (Signed)
Hernia repair, Care After This sheet gives you information about how to care for yourself after your procedure. Your health care provider may also give you more specific instructions. If you have problems or questions, contact your health care provider. What can I expect after the procedure? After your procedure, it is common to have the following:  Pain in your abdomen, especially in the incision areas. You will be given medicine to control the pain.  Tiredness. This is a normal part of the recovery process. Your energy level will return to normal over the next several weeks.  Changes in your bowel movements, such as constipation or needing to go more often. Talk with your health care provider about how to manage this. Follow these instructions at home: Medicines   tylenol and advil as needed for discomfort.  Please alternate between the two every four hours as needed for pain.    RESTART ELIQUIS IN 48HRS   Use narcotics, if prescribed, only when tylenol and motrin is not enough to control pain.   325-650mg  every 8hrs to max of 3000mg /24hrs (including the 325mg  in every norco dose) for the tylenol.     Advil up to 800mg  per dose every 8hrs as needed for pain.    PLEASE RECORD NUMBER OF PILLS TAKEN UNTIL NEXT FOLLOW UP APPT.  THIS WILL HELP DETERMINE HOW READY YOU ARE TO BE RELEASED FROM ANY ACTIVITY RESTRICTIONS  Do not drive or use heavy machinery while taking prescription pain medicine.  Do not drink alcohol while taking prescription pain medicine.  Incision care     Follow instructions from your health care provider about how to take care of your incision areas. Make sure you: ? Keep your incisions clean and dry. ? Wash your hands with soap and water before and after applying medicine to the areas, and before and after changing your bandage (dressing). If soap and water are not available, use hand sanitizer. ? Change your dressing as told by your health care provider. ? Leave  stitches (sutures), skin glue, or adhesive strips in place. These skin closures may need to stay in place for 2 weeks or longer. If adhesive strip edges start to loosen and curl up, you may trim the loose edges. Do not remove adhesive strips completely unless your health care provider tells you to do that.  Do not wear tight clothing over the incisions. Tight clothing may rub and irritate the incision areas, which may cause the incisions to open.  Do not take baths, swim, or use a hot tub until your health care provider approves. OK TO SHOWER IN 24HRS.    Check your incision area every day for signs of infection. Check for: ? More redness, swelling, or pain. ? More fluid or blood. ? Warmth. ? Pus or a bad smell. Activity  Avoid lifting anything that is heavier than 10 lb (4.5 kg) for 2 weeks or until your health care provider says it is okay.  No pushing/pulling greater than 30lbs  You may resume normal activities as told by your health care provider. Ask your health care provider what activities are safe for you.  Take rest breaks during the day as needed. Eating and drinking  Follow instructions from your health care provider about what you can eat after surgery.  To prevent or treat constipation while you are taking prescription pain medicine, your health care provider may recommend that you: ? Drink enough fluid to keep your urine clear or pale yellow. ? Take  over-the-counter or prescription medicines. ? Eat foods that are high in fiber, such as fresh fruits and vegetables, whole grains, and beans. ? Limit foods that are high in fat and processed sugars, such as fried and sweet foods. General instructions  Ask your health care provider when you will need an appointment to get your sutures or staples removed.  Keep all follow-up visits as told by your health care provider. This is important. Contact a health care provider if:  You have more redness, swelling, or pain around  your incisions.  You have more fluid or blood coming from the incisions.  Your incisions feel warm to the touch.  You have pus or a bad smell coming from your incisions or your dressing.  You have a fever.  You have an incision that breaks open (edges not staying together) after sutures or staples have been removed. Get help right away if:  You develop a rash.  You have chest pain or difficulty breathing.  You have pain or swelling in your legs.  You feel light-headed or you faint.  Your abdomen swells (becomes distended).  You have nausea or vomiting.  You have blood in your stool (feces). This information is not intended to replace advice given to you by your health care provider. Make sure you discuss any questions you have with your health care provider. Document Released: 03/08/2005 Document Revised: 05/08/2018 Document Reviewed: 05/20/2016 Elsevier Interactive Patient Education  2019 [place], Adult An indwelling urinary catheter is a thin tube that is put into your bladder. The tube helps to drain pee (urine) out of your body. The tube goes in through your urethra. Your urethra is where pee comes out of your body. Your pee will come out through the catheter, then it will go into a bag (drainage bag). Take good care of your catheter so it will work well. How to wear your catheter and bag Supplies needed  Sticky tape (adhesive tape) or a leg strap.  Alcohol wipe or soap and water (if you use tape).  A clean towel (if you use tape).  Large overnight bag.  Smaller bag (leg bag). Wearing your catheter Attach your catheter to your leg with tape or a leg strap.  Make sure the catheter is not pulled tight.  If a leg strap gets wet, take it off and put on a dry strap.  If you use tape to hold the bag on your leg: 1. Use an alcohol wipe or soap and water to wash your skin where the tape made it sticky before. 2. Use a  clean towel to pat-dry that skin. 3. Use new tape to make the bag stay on your leg. Wearing your bags You should have been given a large overnight bag.  You may wear the overnight bag in the day or night.  Always have the overnight bag lower than your bladder.  Do not let the bag touch the floor.  Before you go to sleep, put a clean plastic bag in a wastebasket. Then hang the overnight bag inside the wastebasket. You should also have a smaller leg bag that fits under your clothes.  Always wear the leg bag below your knee.  Do not wear your leg bag at night. How to care for your skin and catheter Supplies needed  A clean washcloth.  Water and mild soap.  A clean towel. Caring for your skin and catheter      Clean  the skin around your catheter every day: 1. Wash your hands with soap and water. 2. Wet a clean washcloth in warm water and mild soap. 3. Clean the skin around your urethra.  If you are male:  Gently spread the folds of skin around your vagina (labia).  With the washcloth in your other hand, wipe the inner side of your labia on each side. Wipe from front to back.  If you are male:  Pull back any skin that covers the end of your penis (foreskin).  With the washcloth in your other hand, wipe your penis in small circles. Start wiping at the tip of your penis, then move away from the catheter.  Move the foreskin back in place, if needed. 4. With your free hand, hold the catheter close to where it goes into your body.  Keep holding the catheter during cleaning so it does not get pulled out. 5. With the washcloth in your other hand, clean the catheter.  Only wipe downward on the catheter.  Do not wipe upward toward your body. Doing this may push germs into your urethra and cause infection. 6. Use a clean towel to pat-dry the catheter and the skin around it. Make sure to wipe off all soap. 7. Wash your hands with soap and water.  Shower every day. Do not  take baths.  Do not use cream, ointment, or lotion on the area where the catheter goes into your body, unless your doctor tells you to.  Do not use powders, sprays, or lotions on your genital area.  Check your skin around the catheter every day for signs of infection. Check for: ? Redness, swelling, or pain. ? Fluid or blood. ? Warmth. ? Pus or a bad smell. How to empty the bag Supplies needed  Rubbing alcohol.  Gauze pad or cotton ball.  Tape or a leg strap. Emptying the bag Pour the pee out of your bag when it is ?- full, or at least 2-3 times a day. Do this for your overnight bag and your leg bag. 1. Wash your hands with soap and water. 2. Separate (detach) the bag from your leg. 3. Hold the bag over the toilet or a clean pail. Keep the bag lower than your hips and bladder. This is so the pee (urine) does not go back into the tube. 4. Open the pour spout. It is at the bottom of the bag. 5. Empty the pee into the toilet or pail. Do not let the pour spout touch any surface. 6. Put rubbing alcohol on a gauze pad or cotton ball. 7. Use the gauze pad or cotton ball to clean the pour spout. 8. Close the pour spout. 9. Attach the bag to your leg with tape or a leg strap. 10. Wash your hands with soap and water. Follow instructions for cleaning the drainage bag:  From the product maker.  As told by your doctor. How to change the bag Supplies needed  Alcohol wipes.  A clean bag.  Tape or a leg strap. Changing the bag Replace your bag when it starts to leak, smell bad, or look dirty. 1. Wash your hands with soap and water. 2. Separate the dirty bag from your leg. 3. Pinch the catheter with your fingers so that pee does not spill out. 4. Separate the catheter tube from the bag tube where these tubes connect (at the connection valve). Do not let the tubes touch any surface. 5. Clean the end of the catheter  tube with an alcohol wipe. Use a different alcohol wipe to clean the  end of the bag tube. 6. Connect the catheter tube to the tube of the clean bag. 7. Attach the clean bag to your leg with tape or a leg strap. Do not make the bag tight on your leg. 8. Wash your hands with soap and water. General rules   Never pull on your catheter. Never try to take it out. Doing that can hurt you.  Always wash your hands before and after you touch your catheter or bag. Use a mild, fragrance-free soap. If you do not have soap and water, use hand sanitizer.  Always make sure there are no twists or bends (kinks) in the catheter tube.  Always make sure there are no leaks in the catheter or bag.  Drink enough fluid to keep your pee pale yellow.  Do not take baths, swim, or use a hot tub.  If you are male, wipe from front to back after you poop (have a bowel movement). Contact a doctor if:  Your pee is cloudy.  Your pee smells worse than usual.  Your catheter gets clogged.  Your catheter leaks.  Your bladder feels full. Get help right away if:  You have redness, swelling, or pain where the catheter goes into your body.  You have fluid, blood, pus, or a bad smell coming from the area where the catheter goes into your body.  Your skin feels warm where the catheter goes into your body.  You have a fever.  You have pain in your: ? Belly (abdomen). ? Legs. ? Lower back. ? Bladder.  You see blood in the catheter.  Your pee is pink or red.  You feel sick to your stomach (nauseous).  You throw up (vomit).  You have chills.  Your pee is not draining into the bag.  Your catheter gets pulled out. Summary  An indwelling urinary catheter is a thin tube that is placed into the bladder to help drain pee (urine) out of the body.  The catheter is placed into the part of the body that drains pee from the bladder (urethra).  Taking good care of your catheter will keep it working properly and help prevent problems.  Always wash your hands before and after  touching your catheter or bag.  Never pull on your catheter or try to take it out. This information is not intended to replace advice given to you by your health care provider. Make sure you discuss any questions you have with your health care provider. Document Revised: 12/11/2018 Document Reviewed: 04/04/2017 Elsevier Patient Education  [place].  Follow up with [facility] for foley catheter removal and voiding trial.     AMBULATORY SURGERY  DISCHARGE INSTRUCTIONS   1) The drugs that you were given will stay in your system until tomorrow so for the next 24 hours you should not:  A) Drive an automobile B) Make any legal decisions C) Drink any alcoholic beverage   2) You may resume regular meals tomorrow.  Today it is better to start with liquids and gradually work up to solid foods.  You may eat anything you prefer, but it is better to start with liquids, then soup and crackers, and gradually work up to solid foods.   3) Please notify your doctor immediately if you have any unusual bleeding, trouble breathing, redness and pain at the surgery site, drainage, fever, or pain not relieved by medication.  4) Additional Instructions:        Please contact your physician with any problems or Same Day Surgery at [phone], Monday through Friday 6 am to 4 pm, or [place] at [facility] number at [phone].

## 2020-07-10 ENCOUNTER — Encounter: Payer: Self-pay | Admitting: Surgery

## 2020-07-10 LAB — SURGICAL PATHOLOGY

## 2020-07-10 NOTE — Anesthesia Postprocedure Evaluation (Signed)
Anesthesia Post Note  Patient: Evan Ramirez  Procedure(s) Performed: HERNIA REPAIR INGUINAL ADULT (Right Groin)  Patient location during evaluation: PACU Anesthesia Type: General Level of consciousness: awake and alert Pain management: pain level controlled Vital Signs Assessment: post-procedure vital signs reviewed and stable Respiratory status: spontaneous breathing, nonlabored ventilation, respiratory function stable and patient connected to nasal cannula oxygen Cardiovascular status: blood pressure returned to baseline and stable Postop Assessment: no apparent nausea or vomiting Anesthetic complications: no   No complications documented.   Last Vitals:  Vitals:   07/07/20 1528 07/07/20 1715  BP: (!) 126/55 (!) 154/79  Pulse: 70 83  Resp: 14 16  Temp:  36.5 C  SpO2: 98% 99%    Last Pain:  Vitals:   07/10/20 0906  TempSrc:   PainSc: 0-No pain                 Martha Clan

## 2020-07-18 ENCOUNTER — Other Ambulatory Visit: Payer: Self-pay | Admitting: Urology

## 2020-07-18 ENCOUNTER — Other Ambulatory Visit (HOSPITAL_COMMUNITY): Payer: Self-pay | Admitting: Urology

## 2020-07-18 DIAGNOSIS — R972 Elevated prostate specific antigen [PSA]: Secondary | ICD-10-CM

## 2020-08-01 ENCOUNTER — Other Ambulatory Visit: Payer: Self-pay

## 2020-08-01 ENCOUNTER — Ambulatory Visit
Admission: RE | Admit: 2020-08-01 | Discharge: 2020-08-01 | Disposition: A | Discharge status: Home / Self Care | Payer: Medicare Other | Source: Ambulatory Visit | Attending: Urology | Admitting: Urology

## 2020-08-01 DIAGNOSIS — R972 Elevated prostate specific antigen [PSA]: Secondary | ICD-10-CM

## 2020-08-01 MED ORDER — GADOBUTROL 1 MMOL/ML IV SOLN
6.0000 mL | Freq: Once | INTRAVENOUS | Status: AC | PRN
  Administered 2020-08-01: 6 mL via INTRAVENOUS

## 2020-08-31 NOTE — H&P (Signed)
NAME: Evan Ramirez, Evan Ramirez MEDICAL RECORD DE:[id number] ACCOUNT [id number] DATE OF BIRTH:01-22-1930 FACILITY: [facility] LOCATION:  PHYSICIAN:Sharetta Ricchio Gilles Chiquito, MD  HISTORY AND PHYSICAL  DATE OF ADMISSION:  09/14/2020  SAME DAY SURGERY:  01/13.  CHIEF COMPLAINT:  Abnormal prostate MRI scan and elevated PSA.  HISTORY OF PRESENT ILLNESS:  The patient is an 86 year old Caucasian male who presented with acute urinary retention following inguinal hernia repair.  He is currently on maximal drug therapy with tamsulosin and finasteride and performs intermittent  self-catheterization.  He was found to have an elevated PSA of 7.0 ng/mL, which was further evaluated with prostate gland MRI scan.  Prostate MRI indicated a 36 mL prostate with a PI-RADS category 5 lesion of the right apex.  The patient comes in now for  UroNav fusion biopsy of the prostate.  PAST MEDICAL HISTORY: ALLERGIES:  No drug allergies.  CURRENT MEDICATIONS:  Amiodarone, amlodipine, Eliquis, Lasix, lisinopril/HCTZ, magnesium, finasteride and tamsulosin.  PAST SURGICAL HISTORY:  Right inguinal herniorrhaphy 07/07/2020.  PAST AND CURRENT MEDICAL CONDITIONS: 1.  Atrial fibrillation. 2.  Hypertension. 3.  Mitral valve stenosis. 4.  Hyperlipidemia. 5.  History of hepatitis. 6.  BPH.  REVIEW OF SYSTEMS:  The patient denied chest pain, diabetes or stroke.  He does have decreased auditory acuity and uses a hearing aid.  SOCIAL HISTORY:  The patient denied alcohol use.  He smokes a pack a day and has a 60-pack-year history.  FAMILY HISTORY:  Father died at age 91 of a stroke.  Mother died at age 109 of Parkinson disease.  There is no family history of prostate cancer.  PHYSICAL EXAMINATION: VITAL SIGNS:  Height 5 feet 8 inches, weight 154 pounds. GENERAL:  Elderly white male in no acute distress. HEENT:  Sclerae were clear.  Pupils are equally round, reactive to light and accommodation.  Extraocular movements are intact. NECK:  No  palpable masses or tenderness. LYMPHATIC:  No palpable cervical or inguinal adenopathy. PULMONARY:  Lungs clear to auscultation. CARDIOVASCULAR:  Regular rhythm without audible murmurs. ABDOMEN:  Soft, nontender abdomen. GENITOURINARY:  The patient was circumcised.  Testes were smooth, nontender, approximately 20 mL in size each. RECTAL:  40 g prostate with a firm right nodule of the right apex. NEUROMUSCULAR:  Alert and oriented x3.  IMPRESSION: 1.  Prostate nodule. 2.  Elevated PSA. 3.  Abnormal prostate MRI with PI-RADS category 5 lesion of the right apex. 4.  Urinary retention.  PLAN:  UroNav fusion biopsy of prostate.  HN/NUANCE  D:08/28/2020 T:08/28/2020 JOB:[id number]

## 2020-09-05 ENCOUNTER — Other Ambulatory Visit
Admission: RE | Admit: 2020-09-05 | Discharge: 2020-09-05 | Disposition: A | Discharge status: Home / Self Care | Payer: Medicare Other | Source: Ambulatory Visit | Attending: Urology | Admitting: Urology

## 2020-09-05 ENCOUNTER — Other Ambulatory Visit: Payer: Self-pay

## 2020-09-05 HISTORY — DX: Pneumonia, unspecified organism: J18.9

## 2020-09-05 NOTE — Patient Instructions (Addendum)
Your procedure is scheduled on: 09/14/2020-  Thursday Report to the Registration Desk on the 1st floor of the Medical Mall. To find out your arrival time, please call [phone] between 1PM - 3PM on: 09/13/2020- Wednesday  REMEMBER: Instructions that are not followed completely may result in serious medical risk, up to and including death; or upon the discretion of your surgeon and anesthesiologist your surgery may need to be rescheduled.  Do not eat food after midnight the night before surgery.  No gum chewing, lozengers or hard candies.  You may however, drink CLEAR liquids up to 2 hours before you are scheduled to arrive for your surgery. Do not drink anything within 2 hours of your scheduled arrival time.  Clear liquids include: - water  - apple juice without pulp - gatorade (not RED, PURPLE, OR BLUE) - black coffee or tea (Do NOT add milk or creamers to the coffee or tea) Do NOT drink anything that is not on this list.   TAKE THESE MEDICATIONS THE MORNING OF SURGERY WITH A SIP OF WATER: - amiodarone (PACERONE) 200 MG tablet - amLODipine (NORVASC) 5 MG tablet - finasteride (PROSCAR) 5 MG tablet    Fleets enema or Magnesium Citrate as directed, use at home prior to procedure.   Follow recommendations from Cardiologist, Pulmonologist or PCP regarding stopping Aspirin, Coumadin, Plavix, Eliquis, Pradaxa, or Pletal. Stop taking Eliquis on 01/10,  01/11, 01/12, and do not take the morning of surgery.  One week prior to surgery: Stop Anti-inflammatories (NSAIDS) such as Advil, Aleve, Ibuprofen, Motrin, Naproxen, Naprosyn and Aspirin based products such as Excedrin, Goodys Powder, BC Powder.  Stop ANY OVER THE COUNTER supplements until after surgery. (However, you may continue taking Vitamin D, Vitamin B, and multivitamin up until the day before surgery.)  No Alcohol for 24 hours before or after surgery.  No Smoking including e-cigarettes for 24 hours prior to surgery.  No  chewable tobacco products for at least 6 hours prior to surgery.  No nicotine patches on the day of surgery.  Do not use any "recreational" drugs for at least a week prior to your surgery.  Please be advised that the combination of cocaine and anesthesia may have negative outcomes, up to and including death. If you test positive for cocaine, your surgery will be cancelled.  On the morning of surgery brush your teeth with toothpaste and water, you may rinse your mouth with mouthwash if you wish. Do not swallow any toothpaste or mouthwash.  Do not wear jewelry, make-up, hairpins, clips or nail polish.  Do not wear lotions, powders, or perfumes.   Do not shave body from the neck down 48 hours prior to surgery just in case you cut yourself which could leave a site for infection.  Also, freshly shaved skin may become irritated if using the CHG soap.  Contact lenses, hearing aids and dentures may not be worn into surgery.  Do not bring valuables to the hospital. [facility] is not responsible for any missing/lost belongings or valuables.   Notify your doctor if there is any change in your medical condition (cold, fever, infection).  Wear comfortable clothing (specific to your surgery type) to the hospital.  Plan for stool softeners for home use; pain medications have a tendency to cause constipation. You can also help prevent constipation by eating foods high in fiber such as fruits and vegetables and drinking plenty of fluids as your diet allows.  After surgery, you can help prevent lung complications by  doing breathing exercises.  Take deep breaths and cough every 1-2 hours. Your doctor may order a device called an Incentive Spirometer to help you take deep breaths. When coughing or sneezing, hold a pillow firmly against your incision with both hands. This is called "splinting." Doing this helps protect your incision. It also decreases belly discomfort.  If you are being admitted to the  hospital overnight, leave your suitcase in the car. After surgery it may be brought to your room.  If you are being discharged the day of surgery, you will not be allowed to drive home. You will need a responsible adult (18 years or older) to drive you home and stay with you that night.   If you are taking public transportation, you will need to have a responsible adult (18 years or older) with you. Please confirm with your physician that it is acceptable to use public transportation.   Please call the Pre-admissions Testing Dept. at [phone] if you have any questions about these instructions.  Visitation Policy:  Patients undergoing a surgery or procedure may have one family member or support person with them as long as that person is not COVID-19 positive or experiencing its symptoms.  That person may remain in the waiting area during the procedure.  Inpatient Visitation:    Visiting hours are 7 a.m. to 8 p.m. Patients will be allowed one visitor. The visitor may change daily. The visitor must pass COVID-19 screenings, use hand sanitizer when entering and exiting the patient's room and wear a mask at all times, including in the patient's room. Patients must also wear a mask when staff or their visitor are in the room. Masking is required regardless of vaccination status. Systemwide, no visitors 17 or younger.

## 2020-09-08 ENCOUNTER — Encounter: Payer: Self-pay | Admitting: Urology

## 2020-09-08 NOTE — Progress Notes (Signed)
[facility] Perioperative Services  Pre-Admission/Anesthesia Testing Clinical Review  Date: 09/08/20  Patient Demographics:  Name: Evan Ramirez DOB:   09-09-1929 MRN:   [id number]  Planned Surgical Procedure(s):    Case: [id number] Date/Time: 09/14/20 0715   Procedure: PROSTATE BIOPSY URONAV PROSTATE (N/A )   Anesthesia type: Choice   Pre-op diagnosis: ELEVATED PSA   Location: [facility] OR ROOM 10 / [place] ORS FOR ANESTHESIA GROUP   Surgeons: Royston Cowper, MD    NOTE: Available PAT nursing documentation and vital signs have been reviewed. Clinical nursing staff has updated patient's PMH/PSHx, current medication list, and drug allergies/intolerances to ensure comprehensive history available to assist in medical decision making as it pertains to the aforementioned surgical procedure and anticipated anesthetic course.   Clinical Discussion:  FUQUAN Ramirez is a 87 y.o. male who is submitted for pre-surgical anesthesia review and clearance prior to him undergoing the above procedure. Patient is a Current Smoker (60 pack years). Pertinent PMH includes: atrial fibrillation, mitral valve disease, palpitations, SVT, HTN, HLD, BPH.  Patient is followed by cardiology Evan Glassing, MD). He was last seen in the cardiology clinic on 08/16/2020; notes reviewed.  At the time of his clinic visit, patient doing "fairly well" overall from a cardiovascular standpoint.  He denied chest pain, shortness of breath, PND, orthopnea, peripheral edema, vertiginous symptoms, and presyncope/syncope.  Patient has a PMH (+) for atrial fibrillation with variable ventricular response.  Patient on antiarrhythmic (amiodarone) and beta-blocker (metoprolol) for rate control. CHA2DS2-VASc Score = 3.  Patient is chronically anticoagulated using daily apixaban; he is compliant with therapy and reports no evidence of GI bleeding.  48-hour event monitor study performed on 05/01/2017 revealed a predominantly sinus rhythm  with frequent atrial and ventricular ectopy; no sustained runs or prolonged pauses.  Stress echocardiography study on 05/26/2017 revealed normal right ventricular systolic function with moderate to severe valvular insufficiency; LVEF >55% last TTE performed on 06/19/2018 revealed normal left ventricular systolic function with moderate valvular insufficiency; LVEF >55% (see full interpretation of cardiovascular testing below).  Patient on GDMT for his HTN diagnosis.  Blood pressure well controlled at 120/68 on currently prescribed CCB, ACEi, and diuretic therapy.  Lipid management is being accomplished through lifestyle/dietary modifications.  There were no significant changes to patient's medical history.  No changes were made to his medication regimen.  Patient to follow-up with outpatient cardiology in 6 months or sooner if needed.  Patient is scheduled to undergo prostate biopsy on 09/14/2020 with Dr. Maryan Puls.  Given patient's past medical history significant for cardiovascular condition, presurgical cardiac clearance was sought by the PAT team.  Per cardiology, "patient appears to be optimized for surgery at this point.  He is asymptomatic from a cardiac standpoint.  Patient may proceed as planned with an overall LOW risk stratification".  Again, this patient is on daily anticoagulation therapy.  He has been instructed on recommendations from cardiologist and his primary attending surgeon for holding his apixaban for 3 days prior to his procedure.  Patient has been instructed that his last dose apixaban will be on 09/13/2020.  He denies previous perioperative complications with anesthesia. He underwent a general anesthetic course here (ASA II) in 07/2020 with no documented complications.   Vitals with BMI 07/07/2020 07/07/2020 07/07/2020  Height - - -  Weight - - -  BMI - - -  Systolic 546 270 350  Diastolic 79 55 55  Pulse 83 70 64    Providers/Specialists:   NOTE:  Primary physician provider  listed below. Patient may have been seen by APP or partner within same practice.   PROVIDER ROLE / SPECIALTY LAST Towana Badger, MD Urology (Surgeon)  08/28/2020  Valera Castle, MD Primary Care Provider  06/14/2020  Bartholome Bill, MD  Cardiology  08/16/2020   Allergies:  Patient has no known allergies.  Current Home Medications:   No current facility-administered medications for this encounter.   Marland Kitchen amiodarone (PACERONE) 200 MG tablet  . amLODipine (NORVASC) 5 MG tablet  . apixaban (ELIQUIS) 5 MG TABS tablet  . finasteride (PROSCAR) 5 MG tablet  . lisinopril-hydrochlorothiazide (PRINZIDE,ZESTORETIC) 20-12.5 MG tablet  . tamsulosin (FLOMAX) 0.4 MG CAPS capsule  . HYDROcodone-acetaminophen (NORCO) 5-325 MG tablet  . ibuprofen (ADVIL) 800 MG tablet   History:   Past Medical History:  Diagnosis Date  . Atrial fibrillation ([place])   . Hypertension   . Mitral valve disease   . Pneumonia    Past Surgical History:  Procedure Laterality Date  . CATARACT EXTRACTION    . ear drum surgery    . INGUINAL HERNIA REPAIR Right 07/07/2020   Procedure: HERNIA REPAIR INGUINAL ADULT;  Surgeon: Benjamine Sprague, DO;  Location: [facility] ORS;  Service: General;  Laterality: Right;  . TONSILLECTOMY AND ADENOIDECTOMY     Family History  Problem Relation Age of Onset  . Parkinson's disease Mother   . CVA Father   . CAD Father    Social History   Tobacco Use  . Smoking status: Current Every Day Smoker    Packs/day: 1.00    Years: 60.00    Pack years: 60.00    Types: Cigarettes  . Smokeless tobacco: Never Used  Vaping Use  . Vaping Use: Never used  Substance Use Topics  . Alcohol use: No  . Drug use: No    Pertinent Clinical Results:  LABS: Labs reviewed: Acceptable for surgery.       ECG: Date: 06/30/2020 Time ECG obtained: 1356 PM Rate: 64 bpm Rhythm: normal sinus Axis (leads I and aVF): Normal Intervals: PR 174 ms. QRS 90 ms. QTc 441 ms. ST segment and T wave  changes: No evidence of acute ST segment elevation or depression Comparison: Similar to previous tracing obtained on 08/30/2018   IMAGING / PROCEDURES: MRI PROSTATE W/W0 CONTRAST performed on 08/01/2020 1. 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  ROI #1 2. Right apical bulges the capsule inferiorly without clear extracapsular extension 3. No pelvic lymphadenopathy 4. Minimally enlarged nodular transitional zone most consistent with benign prostate hypertrophy (PI-RADS 2) 5. Small 4 mm lesion in the L5 vertebral body is indeterminate  ECHOCARDIOGRAM performed on 06/19/2018 1. LVEF >55% 2. Normal left ventricular systolic function 3. Normal right ventricular systolic function 4. Trivial AR 5. Moderate MR and TR 6. Mild PR 7. No valvular stenosis 8. No evidence of pericardial effusion  STRESS ECHOCARDIOGRAPHY STUDY performed on 05/26/2017 1. Resting EF >55% 2. Post-rest EF >55% 3. Normal right ventricular systolic function 4. No valvular stenosis 5. Moderate TR 6. Moderate to severe MR 7. Trivial AR 8. No evidence of pericardial effusion 9. No regional wall motion abnormalities  48 HOUR HOLTER MONITOR STUDY performed on 05/01/2017 1. Predominantly sinus rhythm with frequent PACs and PVCs 2. No sustained runs 3. No prolonged pauses 4. Minimum heart rate 40 bpm 5. Maximum heart rate 159 bpm  Impression and Plan:  PEARLIE LAFOSSE has been referred  for pre-anesthesia review and clearance prior to him undergoing the planned anesthetic and procedural courses. Available labs, pertinent testing, and imaging results were personally reviewed by me. This patient has been appropriately cleared by cardiology with an overall LOW risk of serious/significant perioperative cardiovascular complications..   Based on clinical review performed today (09/08/20), barring any significant acute changes in the patient's  overall condition, it is anticipated that he will be able to proceed with the planned surgical intervention. Any acute changes in clinical condition may necessitate his procedure being postponed and/or cancelled. Pre-surgical instructions were reviewed with the patient during his PAT appointment and questions were fielded by PAT clinical staff.  Honor Loh, MSN, APRN, FNP-C, CEN [facility]  Peri-operative Services Nurse Practitioner Phone: [phone] 09/08/20 1:24 PM  NOTE: This note has been prepared using Dragon dictation software. Despite my best ability to proofread, there is always the potential that unintentional transcriptional errors may still occur from this process.

## 2020-09-12 ENCOUNTER — Other Ambulatory Visit: Payer: Self-pay

## 2020-09-12 ENCOUNTER — Other Ambulatory Visit
Admission: RE | Admit: 2020-09-12 | Discharge: 2020-09-12 | Disposition: A | Discharge status: Home / Self Care | Payer: Medicare Other | Source: Ambulatory Visit | Attending: Urology | Admitting: Urology

## 2020-09-12 DIAGNOSIS — Z20822 Contact with and (suspected) exposure to covid-19: Secondary | ICD-10-CM | POA: Diagnosis not present

## 2020-09-12 DIAGNOSIS — Z01812 Encounter for preprocedural laboratory examination: Secondary | ICD-10-CM | POA: Insufficient documentation

## 2020-09-12 LAB — SARS CORONAVIRUS 2 (TAT 6-24 HRS): SARS Coronavirus 2: NEGATIVE

## 2020-09-14 ENCOUNTER — Ambulatory Visit: Payer: Medicare Other | Admitting: Urgent Care

## 2020-09-14 ENCOUNTER — Ambulatory Visit
Admission: RE | Admit: 2020-09-14 | Discharge: 2020-09-14 | Disposition: A | Discharge status: Home / Self Care | Payer: Medicare Other | Attending: Urology | Admitting: Urology

## 2020-09-14 ENCOUNTER — Encounter: Payer: Self-pay | Admitting: Urology

## 2020-09-14 ENCOUNTER — Encounter
Admission: RE | Disposition: A | Discharge status: Home / Self Care | Payer: Self-pay | Source: Home / Self Care | Attending: Urology

## 2020-09-14 ENCOUNTER — Other Ambulatory Visit: Payer: Self-pay

## 2020-09-14 DIAGNOSIS — Z79899 Other long term (current) drug therapy: Secondary | ICD-10-CM | POA: Diagnosis not present

## 2020-09-14 DIAGNOSIS — F17210 Nicotine dependence, cigarettes, uncomplicated: Secondary | ICD-10-CM | POA: Insufficient documentation

## 2020-09-14 DIAGNOSIS — I050 Rheumatic mitral stenosis: Secondary | ICD-10-CM | POA: Insufficient documentation

## 2020-09-14 DIAGNOSIS — R338 Other retention of urine: Secondary | ICD-10-CM | POA: Insufficient documentation

## 2020-09-14 DIAGNOSIS — I4891 Unspecified atrial fibrillation: Secondary | ICD-10-CM | POA: Diagnosis not present

## 2020-09-14 DIAGNOSIS — N401 Enlarged prostate with lower urinary tract symptoms: Secondary | ICD-10-CM | POA: Insufficient documentation

## 2020-09-14 DIAGNOSIS — I10 Essential (primary) hypertension: Secondary | ICD-10-CM | POA: Insufficient documentation

## 2020-09-14 DIAGNOSIS — N4289 Other specified disorders of prostate: Secondary | ICD-10-CM | POA: Diagnosis present

## 2020-09-14 DIAGNOSIS — Z7901 Long term (current) use of anticoagulants: Secondary | ICD-10-CM | POA: Diagnosis not present

## 2020-09-14 DIAGNOSIS — C61 Malignant neoplasm of prostate: Secondary | ICD-10-CM | POA: Insufficient documentation

## 2020-09-14 HISTORY — DX: Hyperlipidemia, unspecified: E78.5

## 2020-09-14 HISTORY — PX: PROSTATE BIOPSY: SHX241

## 2020-09-14 HISTORY — DX: Benign prostatic hyperplasia without lower urinary tract symptoms: N40.0

## 2020-09-14 SURGERY — BIOPSY, PROSTATE
Anesthesia: General

## 2020-09-14 MED ORDER — PROPOFOL 500 MG/50ML IV EMUL
INTRAVENOUS | Status: AC
  Filled 2020-09-14: qty 50

## 2020-09-14 MED ORDER — FAMOTIDINE 20 MG PO TABS
20.0000 mg | ORAL_TABLET | Freq: Once | ORAL | Status: AC
  Administered 2020-09-14: 20 mg via ORAL

## 2020-09-14 MED ORDER — CHLORHEXIDINE GLUCONATE 0.12 % MT SOLN: 15.0000 mL | Freq: Once | OROMUCOSAL | Status: AC

## 2020-09-14 MED ORDER — ORAL CARE MOUTH RINSE: 15.0000 mL | Freq: Once | OROMUCOSAL | Status: AC

## 2020-09-14 MED ORDER — GENTAMICIN IN SALINE 1.6-0.9 MG/ML-% IV SOLN
80.0000 mg | INTRAVENOUS | Status: AC
  Administered 2020-09-14: 80 mg via INTRAVENOUS
  Filled 2020-09-14: qty 50

## 2020-09-14 MED ORDER — FENTANYL CITRATE (PF) 100 MCG/2ML IJ SOLN: 25.0000 ug | INTRAMUSCULAR | Status: DC | PRN

## 2020-09-14 MED ORDER — CHLORHEXIDINE GLUCONATE 0.12 % MT SOLN
OROMUCOSAL | Status: AC
  Administered 2020-09-14: 15 mL via OROMUCOSAL
  Filled 2020-09-14: qty 15

## 2020-09-14 MED ORDER — GENTAMICIN SULFATE 40 MG/ML IJ SOLN
80.0000 mg | Freq: Once | INTRAVENOUS | Status: DC
  Filled 2020-09-14: qty 2

## 2020-09-14 MED ORDER — PROPOFOL 500 MG/50ML IV EMUL
INTRAVENOUS | Status: DC | PRN
  Administered 2020-09-14: 150 ug/kg/min via INTRAVENOUS

## 2020-09-14 MED ORDER — PROPOFOL 10 MG/ML IV BOLUS
INTRAVENOUS | Status: DC | PRN
  Administered 2020-09-14: 30 mg via INTRAVENOUS
  Administered 2020-09-14 (×2): 20 mg via INTRAVENOUS

## 2020-09-14 MED ORDER — PROPOFOL 10 MG/ML IV BOLUS
INTRAVENOUS | Status: AC
  Filled 2020-09-14: qty 20

## 2020-09-14 MED ORDER — FAMOTIDINE 20 MG PO TABS
ORAL_TABLET | ORAL | Status: AC
  Filled 2020-09-14: qty 1

## 2020-09-14 MED ORDER — ONDANSETRON HCL 4 MG/2ML IJ SOLN: 4.0000 mg | Freq: Once | INTRAMUSCULAR | Status: DC | PRN

## 2020-09-14 MED ORDER — FLEET ENEMA 7-19 GM/118ML RE ENEM
1.0000 | ENEMA | Freq: Once | RECTAL | Status: AC
  Administered 2020-09-14: 1 via RECTAL

## 2020-09-14 MED ORDER — CEFAZOLIN SODIUM-DEXTROSE 1-4 GM/50ML-% IV SOLN
1.0000 g | Freq: Once | INTRAVENOUS | Status: AC
  Administered 2020-09-14: 1 g via INTRAVENOUS

## 2020-09-14 MED ORDER — ACETAMINOPHEN 325 MG PO TABS
325.0000 mg | ORAL_TABLET | ORAL | Status: DC | PRN
Start: 2020-09-14 — End: 2020-09-14

## 2020-09-14 MED ORDER — LACTATED RINGERS IV SOLN: INTRAVENOUS | Status: DC

## 2020-09-14 MED ORDER — HYDROCODONE-ACETAMINOPHEN 7.5-325 MG PO TABS
1.0000 | ORAL_TABLET | Freq: Once | ORAL | Status: DC | PRN
  Filled 2020-09-14: qty 1

## 2020-09-14 MED ORDER — ACETAMINOPHEN 160 MG/5ML PO SOLN
325.0000 mg | ORAL | Status: DC | PRN
  Filled 2020-09-14: qty 20.3

## 2020-09-14 MED ORDER — DEXMEDETOMIDINE (PRECEDEX) IN NS 20 MCG/5ML (4 MCG/ML) IV SYRINGE
PREFILLED_SYRINGE | INTRAVENOUS | Status: DC | PRN
  Administered 2020-09-14 (×2): 4 ug via INTRAVENOUS

## 2020-09-14 MED ORDER — LEVOFLOXACIN 500 MG PO TABS
500.0000 mg | ORAL_TABLET | Freq: Every day | ORAL | 0 refills | Status: DC
Start: 2020-09-14 — End: 2022-09-11

## 2020-09-14 SURGICAL SUPPLY — 19 items
COVER MAYO STAND REUSABLE (DRAPES) ×2 IMPLANT
COVER TRANSDUCER ULTRASOUND (MISCELLANEOUS) ×1 IMPLANT
GLOVE SURG ENC MOIS LTX SZ7 (GLOVE) ×4 IMPLANT
GUIDE NDL ENDOCAV 16-18 CVR (NEEDLE) IMPLANT
GUIDE NDL URONAV ULTRASND S (MISCELLANEOUS) IMPLANT
GUIDE NEEDLE ENDOCAV 16-18 CVR (NEEDLE) IMPLANT
GUIDE NEEDLE URONAV ULTRASND S (MISCELLANEOUS) ×1 IMPLANT
INST BIOPSY MAXCORE 18GX25 (NEEDLE) ×2 IMPLANT
MANIFOLD NEPTUNE II (INSTRUMENTS) ×2 IMPLANT
NDL GUIDE BIOPSY 644068 (NEEDLE) IMPLANT
NEEDLE GUIDE BIOPSY 644068 (NEEDLE) IMPLANT
PROBE URONAV BK 8808E 8818 HLD (MISCELLANEOUS) IMPLANT
STRAP SAFETY BODY (MISCELLANEOUS) ×2 IMPLANT
SURGILUBE 2OZ TUBE FLIPTOP (MISCELLANEOUS) ×2 IMPLANT
TOWEL OR 17X26 4PK STRL BLUE (TOWEL DISPOSABLE) ×2 IMPLANT
URONAV BK 8808E 8818 PROBE HLD (MISCELLANEOUS) ×2
URONAV MRI FUSION TWO PATIENTS (MISCELLANEOUS) ×1 IMPLANT
URONAV ULTRASOUND (MISCELLANEOUS) ×1 IMPLANT
URONAV ULTRASOUND NDL GUIDE S (MISCELLANEOUS) ×2

## 2020-09-14 NOTE — H&P (Signed)
Date of Initial H&P: 08/28/20  History reviewed, patient examined, no change in status, stable for surgery.

## 2020-09-14 NOTE — Op Note (Signed)
Preoperative diagnosis: 1.  Elevated PSA (C61)                                             2.  Abnormal prostate MRI scan (D40.0)  Postoperative diagnosis: Same  Procedure: Uronav fusion biopsy of the prostate (CPT [place], [phone])  Surgeon: Otelia Limes. Yves Dill MD  Anesthesia: General  Indications:See the history and physical. After informed consent the above procedure(s) were requested     Technique and findings: After adequate general anesthesia been obtained the patient was placed into a left lateral decubitus position.  Finger sweep of the rectal vault indicated that the rectal vault was clear.  A prostate nodule was palpable at the right apex and extending into the mid right prostate.  The ultrasound probe was then placed and images acquired.  Ultrasound images were then fused with the MRI images.  The region of interest was identified and 4 core biopsies obtained from this region.  At this point standard 12 core systematic core biopsies were obtained.  The ultrasound probe was then removed.  Blood loss was minimal.  The procedure was then terminated and patient transferred to the recovery room in stable condition.

## 2020-09-14 NOTE — Anesthesia Procedure Notes (Signed)
Date/Time: 09/14/2020 7:45 AM Performed by: Allean Found, CRNA Pre-anesthesia Checklist: Suction available, Emergency Drugs available, Patient identified, Patient being monitored and Timeout performed Oxygen Delivery Method: Nasal cannula Placement Confirmation: positive ETCO2

## 2020-09-14 NOTE — Transfer of Care (Signed)
Immediate Anesthesia Transfer of Care Note  Patient: Evan Ramirez  Procedure(s) Performed: PROSTATE BIOPSY URONAV PROSTATE (N/A )  Patient Location: PACU  Anesthesia Type:General  Level of Consciousness: sedated  Airway & Oxygen Therapy: Patient Spontanous Breathing and Patient connected to nasal cannula oxygen  Post-op Assessment: Report given to RN and Post -op Vital signs reviewed and stable  Post vital signs: Reviewed and stable  Last Vitals:  Vitals Value Taken Time  BP 132/66 09/14/20 0815  Temp 36.6 C 09/14/20 0815  Pulse 64 09/14/20 0817  Resp 17 09/14/20 0817  SpO2 98 % 09/14/20 0817  Vitals shown include unvalidated device data.  Last Pain:  Vitals:   09/14/20 0637  TempSrc: Oral  PainSc: 0-No pain         Complications: No complications documented.

## 2020-09-14 NOTE — Anesthesia Preprocedure Evaluation (Addendum)
Anesthesia Evaluation  Patient identified by MRN, date of birth, ID band Patient awake    Reviewed: Allergy & Precautions, H&P , NPO status , reviewed documented beta blocker date and time   Airway Mallampati: II  TM Distance: >3 FB Neck ROM: full    Dental  (+) Edentulous Lower, Edentulous Upper   Pulmonary pneumonia, resolved, Current Smoker and Patient abstained from smoking.,    Pulmonary exam normal        Cardiovascular hypertension, Normal cardiovascular exam  NSR on EKG   Neuro/Psych    GI/Hepatic (+) Hepatitis -  Endo/Other    Renal/GU      Musculoskeletal   Abdominal   Peds  Hematology   Anesthesia Other Findings Past Medical History: No date: Atrial fibrillation (HCC) No date: BPH (benign prostatic hyperplasia) No date: HLD (hyperlipidemia) No date: Hypertension No date: Mitral valve disease No date: Pneumonia  Past Surgical History: No date: CATARACT EXTRACTION No date: ear drum surgery 07/07/2020: INGUINAL HERNIA REPAIR; Right     Comment:  Procedure: HERNIA REPAIR INGUINAL ADULT;  Surgeon:               Benjamine Sprague, DO;  Location: [facility] ORS;  Service: General;              Laterality: Right; No date: TONSILLECTOMY AND ADENOIDECTOMY  BMI    Body Mass Index: 23.57 kg/m      Reproductive/Obstetrics                            Anesthesia Physical Anesthesia Plan  ASA: III  Anesthesia Plan: General   Post-op Pain Management:    Induction: Intravenous  PONV Risk Score and Plan: Treatment may vary due to age or medical condition and TIVA  Airway Management Planned: Nasal Cannula and Natural Airway  Additional Equipment:   Intra-op Plan:   Post-operative Plan:   Informed Consent: I have reviewed the patients History and Physical, chart, labs and discussed the procedure including the risks, benefits and alternatives for the proposed anesthesia with the patient  or authorized representative who has indicated his/her understanding and acceptance.     Dental Advisory Given  Plan Discussed with: CRNA  Anesthesia Plan Comments: (Poss LMA)        Anesthesia Quick Evaluation

## 2020-09-14 NOTE — Discharge Instructions (Addendum)
Transrectal Ultrasound-Guided Prostate Biopsy, Care After This sheet gives you information about how to care for yourself after your procedure. Your doctor may also give you more specific instructions. If you have problems or questions, contact your doctor. What can I expect after the procedure? After the procedure, it is common to have:  Pain and discomfort in your butt, especially while sitting.  Pink-colored pee (urine), due to small amounts of blood in the pee.  Burning while peeing (urinating).  Blood in your poop (stool).  Bleeding from your butt.  Blood in your semen. Follow these instructions at home: Medicines  Take over-the-counter and prescription medicines only as told by your doctor.  If you were prescribed antibiotic medicine, take it as told by your doctor. Do not stop taking the antibiotic even if you start to feel better. Activity  Do not drive for 24 hours if you were given a medicine to help you relax (sedative) during your procedure.  Return to your normal activities as told by your doctor. Ask your doctor what activities are safe for you.  Ask your doctor when it is okay for you to have sex.  Do not lift anything that is heavier than 10 lb (4.5 kg), or the limit that you are told, until your doctor says that it is safe.   General instructions  Drink enough water to keep your pee pale yellow.  Watch your pee, poop, and semen for new bleeding or bleeding that gets worse.  Keep all follow-up visits as told by your doctor. This is important.   Contact a doctor if you:  Have blood clots in your pee or poop.  Notice that your pee smells bad or unusual.  Have very bad belly pain.  Have trouble peeing.  Notice that your lower belly feels firm.  Have blood in your pee for more than 2 weeks after the procedure.  Have blood in your semen for more than 2 months after the procedure.  Have problems getting an erection.  Feel sick to your stomach  (nauseous) or throw up (vomit).  Have new or worse bleeding in your pee, poop, or semen. Get help right away if you:  Have a fever or chills.  Have bright red pee.  Have very bad pain that does not get better with medicine.  Cannot pee. Summary  After this procedure, it is common to have pain and discomfort around your butt, especially while sitting.  You may have blood in your pee and poop.  It is common to have blood in your semen for 1-2 months.  If you were prescribed antibiotic medicine, take it as told by your doctor. Do not stop taking the antibiotic even if you start to feel better.  Get help right away if you have a fever or chills. This information is not intended to replace advice given to you by your health care provider. Make sure you discuss any questions you have with your health care provider. Document Revised: 07/03/2020 Document Reviewed: 05/04/2020 Elsevier Patient Education  2021 [employer]   AMBULATORY SURGERY  DISCHARGE INSTRUCTIONS   1) The drugs that you were given will stay in your system until tomorrow so for the next 24 hours you should not:  A) Drive an automobile B) Make any legal decisions C) Drink any alcoholic beverage   2) You may resume regular meals tomorrow.  Today it is better to start with liquids and gradually work up to solid foods.  You may eat anything   you prefer, but it is better to start with liquids, then soup and crackers, and gradually work up to solid foods.   3) Please notify your doctor immediately if you have any unusual bleeding, trouble breathing, redness and pain at the surgery site, drainage, fever, or pain not relieved by medication.    4) Additional Instructions:        Please contact your physician with any problems or Same Day Surgery at [phone], Monday through Friday 6 am to 4 pm, or [facility] Main number at [phone].  

## 2020-09-14 NOTE — Anesthesia Postprocedure Evaluation (Signed)
Anesthesia Post Note  Patient: Evan Ramirez  Procedure(s) Performed: PROSTATE BIOPSY URONAV PROSTATE (N/A )  Patient location during evaluation: PACU Anesthesia Type: General Level of consciousness: awake and alert Pain management: pain level controlled Vital Signs Assessment: post-procedure vital signs reviewed and stable Respiratory status: spontaneous breathing, nonlabored ventilation and respiratory function stable Cardiovascular status: blood pressure returned to baseline and stable Postop Assessment: no apparent nausea or vomiting Anesthetic complications: no   No complications documented.   Last Vitals:  Vitals:   09/14/20 0854 09/14/20 0930  BP: (!) 151/70   Pulse: (!) 56 65  Resp: 16 16  Temp: (!) 36.2 C (!) 36.4 C  SpO2: 99% 98%    Last Pain:  Vitals:   09/14/20 0930  TempSrc: Temporal  PainSc: 0-No pain                 Alphonsus Sias

## 2020-09-22 LAB — SURGICAL PATHOLOGY

## 2020-11-08 ENCOUNTER — Encounter: Payer: Self-pay | Admitting: Dermatology

## 2020-11-08 ENCOUNTER — Other Ambulatory Visit: Payer: Self-pay

## 2020-11-08 ENCOUNTER — Ambulatory Visit (INDEPENDENT_AMBULATORY_CARE_PROVIDER_SITE_OTHER): Payer: Medicare Other | Admitting: Dermatology

## 2020-11-08 DIAGNOSIS — L720 Epidermal cyst: Secondary | ICD-10-CM

## 2020-11-08 DIAGNOSIS — L821 Other seborrheic keratosis: Secondary | ICD-10-CM

## 2020-11-08 DIAGNOSIS — L570 Actinic keratosis: Secondary | ICD-10-CM

## 2020-11-08 DIAGNOSIS — L820 Inflamed seborrheic keratosis: Secondary | ICD-10-CM | POA: Diagnosis not present

## 2020-11-08 DIAGNOSIS — L578 Other skin changes due to chronic exposure to nonionizing radiation: Secondary | ICD-10-CM | POA: Diagnosis not present

## 2020-11-08 NOTE — Progress Notes (Signed)
   New Patient Visit  Subjective  Evan Ramirez is a 87 y.o. male who presents for the following: check spots (Face, scaly, not sure over 80m, no hx of skin ca). He has other spots to be checked today.  Patient accompanied by wife who contributes to history.  The following portions of the chart were reviewed this encounter and updated as appropriate:   Tobacco  Allergies  Meds  Problems  Med Hx  Surg Hx  Fam Hx     Review of Systems:  No other skin or systemic complaints except as noted in HPI or Assessment and Plan.  Objective  Well appearing patient in no apparent distress; mood and affect are within normal limits.  A focused examination was performed including face, ears, scalp. Relevant physical exam findings are noted in the Assessment and Plan.  Objective  L ear x 1, L preauricular x 1, L mandible x 1, L temple x 1, R infra auricular x 1 (5): Pink scaly macules   Objective  forehead x 2 (2): Erythematous keratotic or waxy stuck-on papule or plaque.    Assessment & Plan    Actinic Damage - chronic, secondary to cumulative UV radiation exposure/sun exposure over time - diffuse scaly erythematous macules with underlying dyspigmentation - Recommend daily broad spectrum sunscreen SPF 30+ to sun-exposed areas, reapply every 2 hours as needed.  - Recommend staying in the shade or wearing long sleeves, sun glasses (UVA+UVB protection) and wide brim hats (4-inch brim around the entire circumference of the hat). - Call for new or changing lesions.  Seborrheic Keratoses - Stuck-on, waxy, tan-brown papules and plaques  - Discussed benign etiology and prognosis. - Observe - Call for any changes  Milia - tiny firm white papules - type of cyst - benign - may be extracted if symptomatic - observe  AK (actinic keratosis) (5) L ear x 1, L preauricular x 1, L mandible x 1, L temple x 1, R infra auricular x 1  If not clear in 2 months rtc  Destruction of lesion - L  ear x 1, L preauricular x 1, L mandible x 1, L temple x 1, R infra auricular x 1 Complexity: simple   Destruction method: cryotherapy   Informed consent: discussed and consent obtained   Timeout:  patient name, date of birth, surgical site, and procedure verified Lesion destroyed using liquid nitrogen: Yes   Region frozen until ice ball extended beyond lesion: Yes   Outcome: patient tolerated procedure well with no complications   Post-procedure details: wound care instructions given    Inflamed seborrheic keratosis (2) forehead x 2  Destruction of lesion - forehead x 2 Complexity: simple   Destruction method: cryotherapy   Informed consent: discussed and consent obtained   Timeout:  patient name, date of birth, surgical site, and procedure verified Lesion destroyed using liquid nitrogen: Yes   Region frozen until ice ball extended beyond lesion: Yes   Outcome: patient tolerated procedure well with no complications   Post-procedure details: wound care instructions given    Return in about 6 months (around 05/11/2021) for AK f/u.   I, Othelia Pulling, RMA, am acting as scribe for Sarina Ser, MD .  Documentation: I have reviewed the above documentation for accuracy and completeness, and I agree with the above.  Sarina Ser, MD

## 2020-12-28 ENCOUNTER — Other Ambulatory Visit: Payer: Self-pay | Admitting: Radiology

## 2020-12-28 DIAGNOSIS — C61 Malignant neoplasm of prostate: Secondary | ICD-10-CM

## 2021-01-15 ENCOUNTER — Ambulatory Visit
Admission: RE | Admit: 2021-01-15 | Discharge: 2021-01-15 | Disposition: A | Discharge status: Home / Self Care | Payer: Medicare Other | Source: Ambulatory Visit | Attending: Radiology | Admitting: Radiology

## 2021-01-15 ENCOUNTER — Other Ambulatory Visit: Payer: Self-pay

## 2021-01-15 DIAGNOSIS — C61 Malignant neoplasm of prostate: Secondary | ICD-10-CM | POA: Diagnosis present

## 2021-05-24 ENCOUNTER — Ambulatory Visit: Payer: Medicare Other | Admitting: Dermatology

## 2022-08-03 IMAGING — CT CT PELVIS W/O CM
2 of 3 series · 17 of 46 positions shown, 19 images · non-contrast
Comparison: MRI of the prostate from August 01, 2020.

CLINICAL DATA: Evaluate seed placement.

EXAM:
CT PELVIS WITHOUT CONTRAST
TECHNIQUE: Multidetector CT imaging of the pelvis was performed following the
standard protocol without intravenous contrast.

[Series 2: routine abd/pel wo · axial · 0.81mm/px · z∈[-447,-177]mm · 14 of 64 slices shown, 16 images]
[im 5/64  soft-tissue]
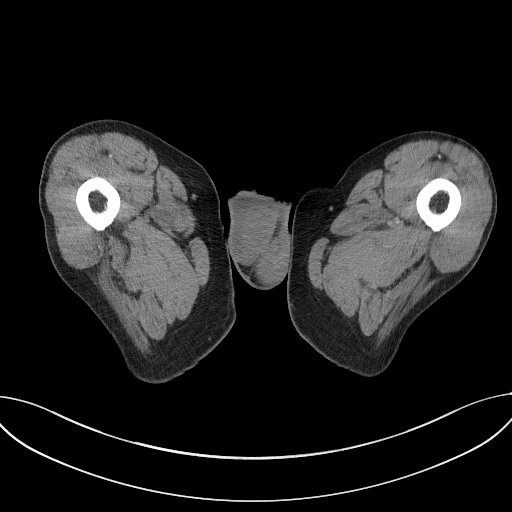
[im 5/64  bone]
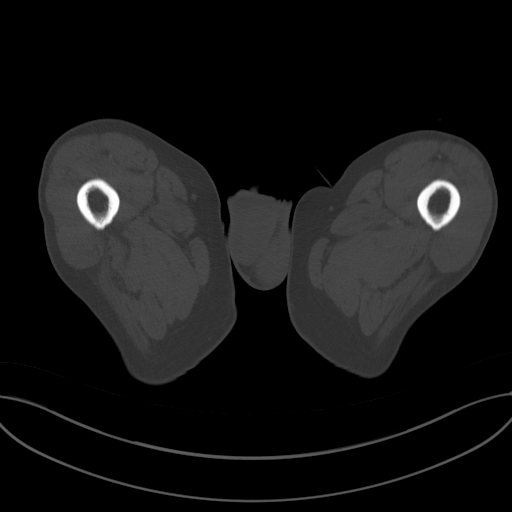
[im 9/64  soft-tissue]
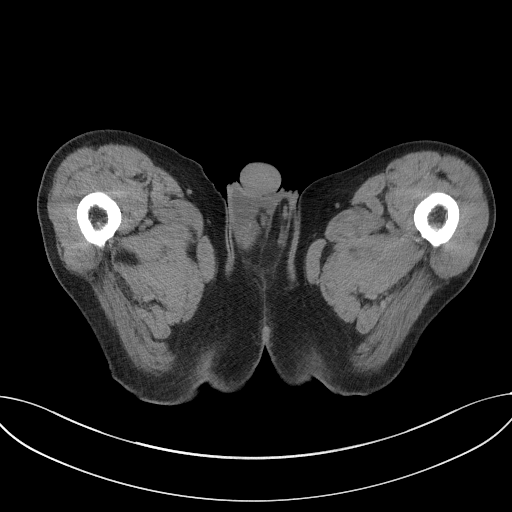
[im 13/64  soft-tissue]
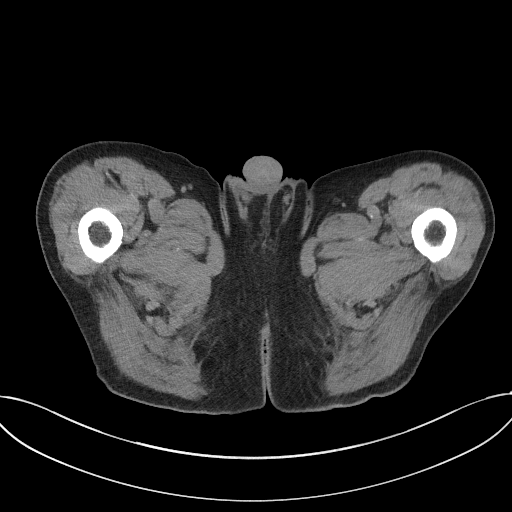
[im 17/64  soft-tissue]
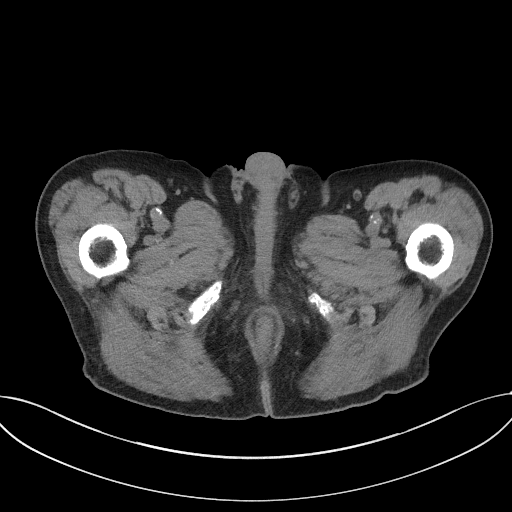
[im 21/64  soft-tissue]
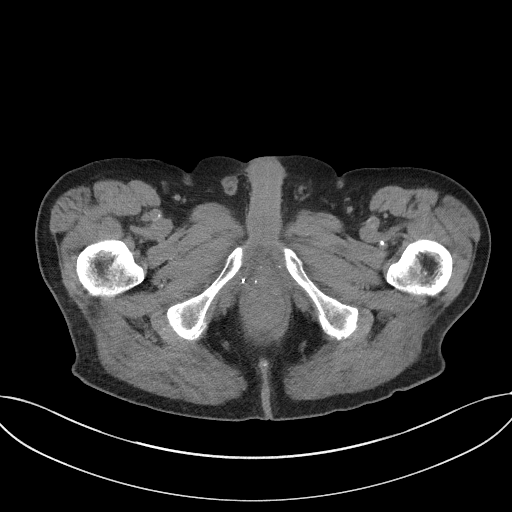
[im 25/64  soft-tissue]
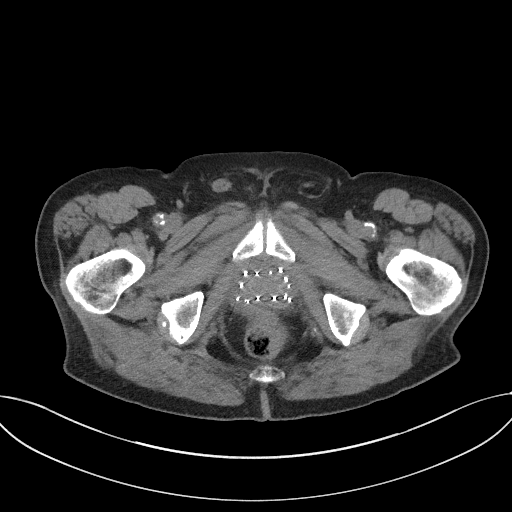
[im 29/64  soft-tissue]
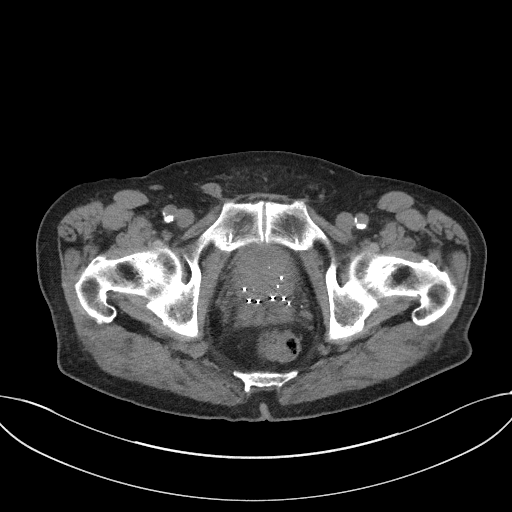
[im 35/64  soft-tissue]
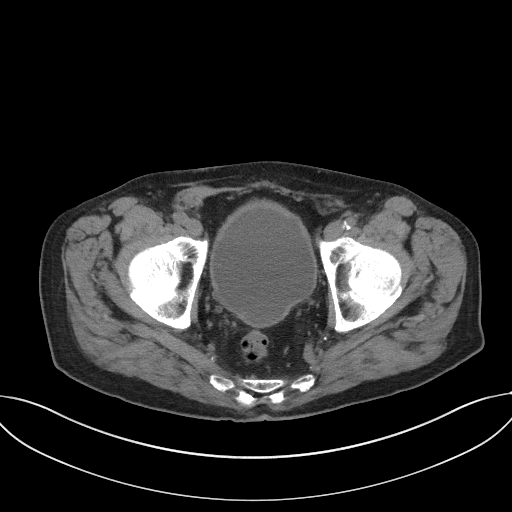
[im 39/64  soft-tissue]
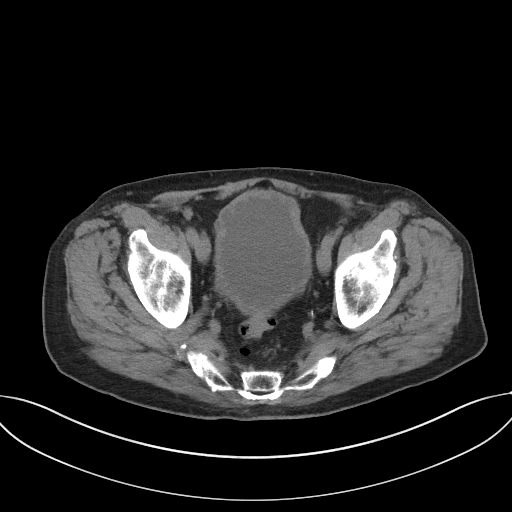
[im 39/64  bone]
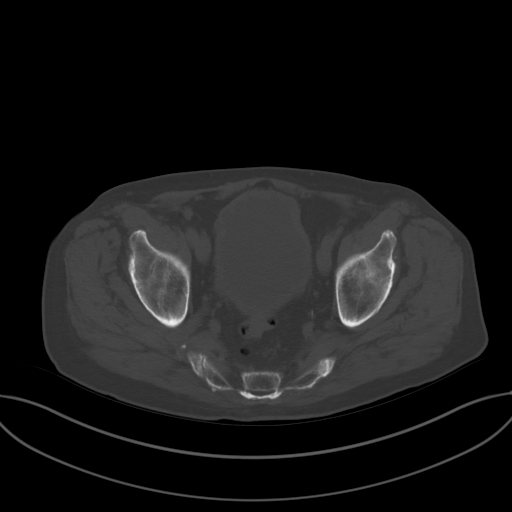
[im 43/64  soft-tissue]
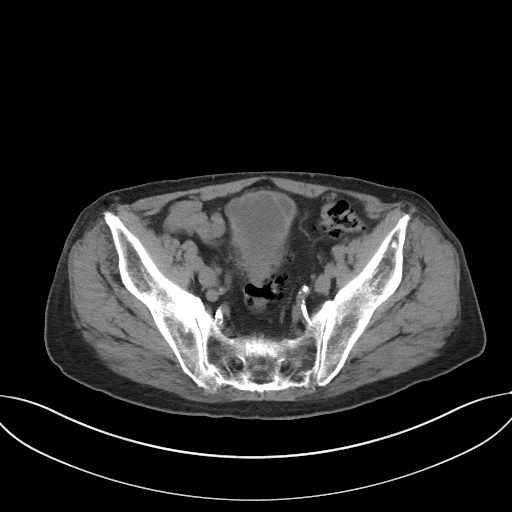
[im 47/64  soft-tissue]
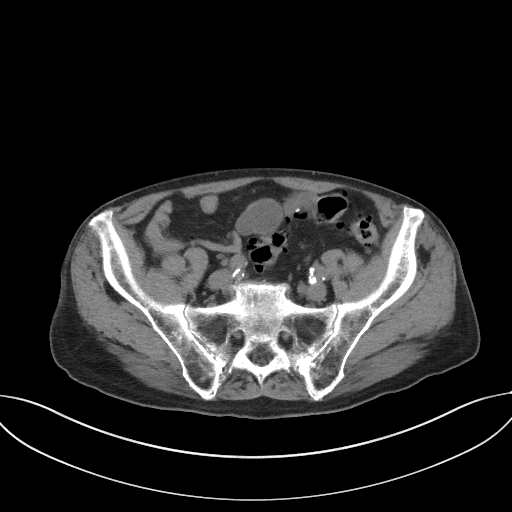
[im 51/64  soft-tissue]
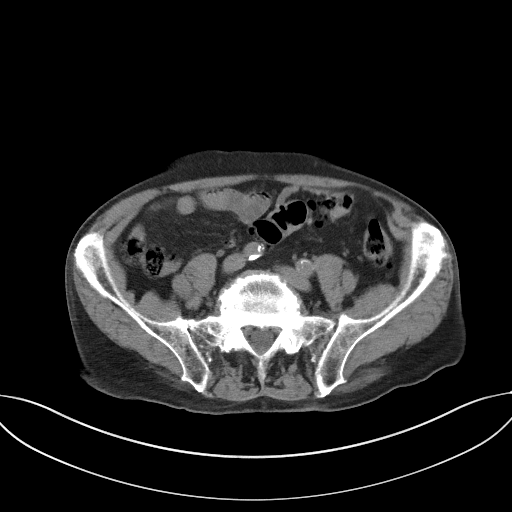
[im 55/64  soft-tissue]
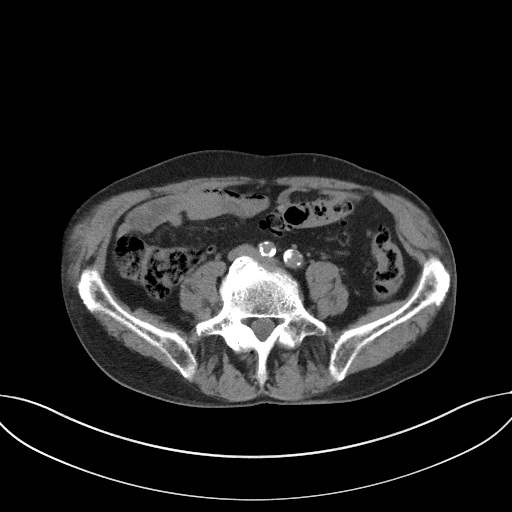
[im 59/64  soft-tissue]
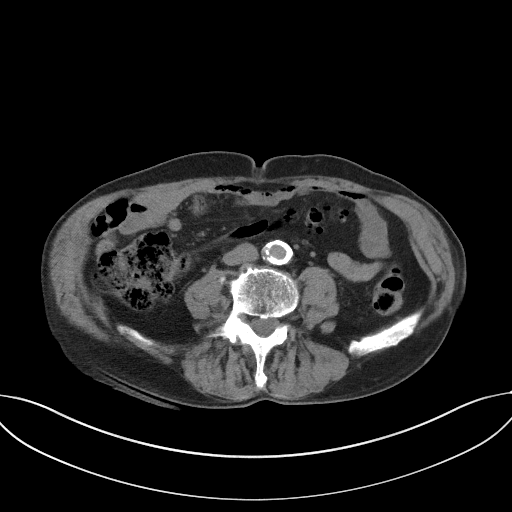

[Series 4: coronal st · coronal · 0.67mm/px · 3 of 82 slices shown]
[im 28/82  soft-tissue]
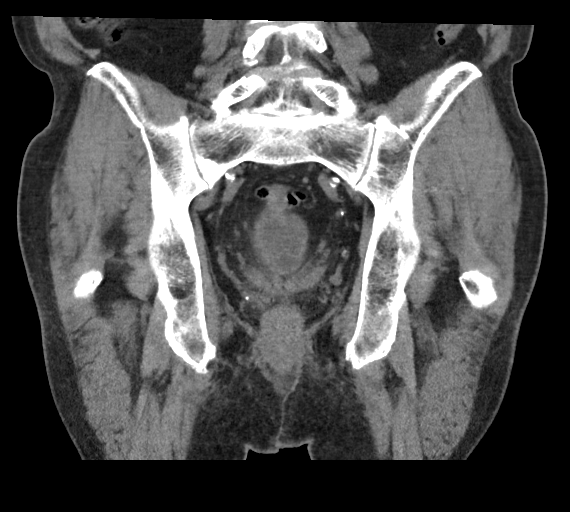
[im 37/82  soft-tissue]
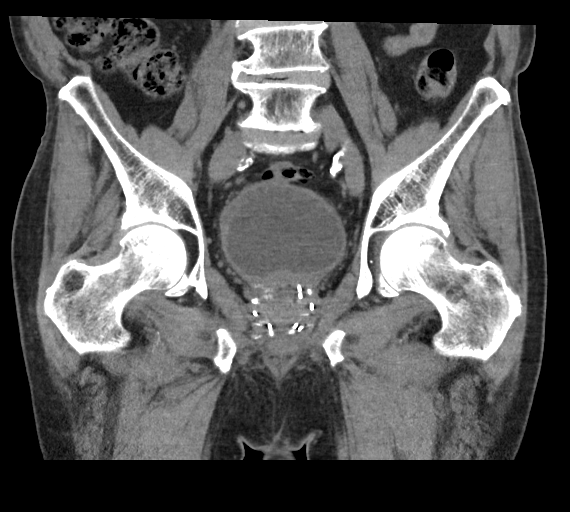
[im 46/82  soft-tissue]
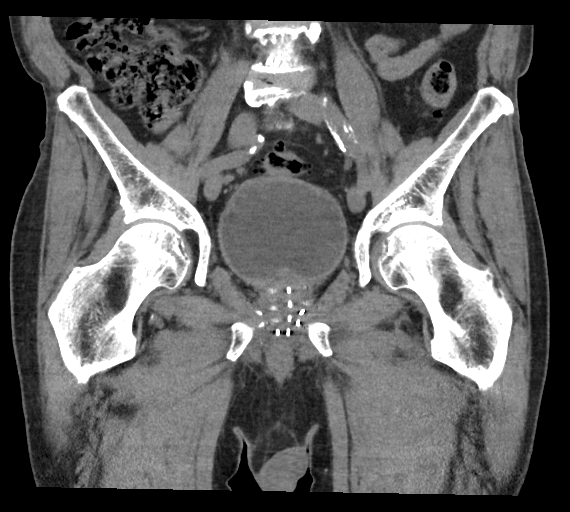

[17 of 46 positions shown; findings below may reference images not displayed]

FINDINGS: Urinary Tract: Trabeculated urinary bladder. No perivesical
stranding. No distal ureteral dilation.

Bowel: Colonic diverticulosis and diverticular disease. Normal
appendix. No bowel dilation or signs of adjacent stranding.

Vascular/Lymphatic: Densely calcified distal abdominal aorta with
calcifications tracking into the iliac vessels.

No pelvic lymphadenopathy.

Reproductive: Prostate with brachytherapy seeds throughout the
gland.

Other:  No ascites.

Musculoskeletal: No suspicious bone lesions identified.
IMPRESSION: 1. Trabeculated urinary bladder. Correlate with any history of
bladder outlet obstruction.
2. Brachytherapy seeds throughout the prostate gland.
3. Colonic diverticulosis and diverticular disease.
4. Aortic atherosclerosis.

Aortic Atherosclerosis ([id number]).

## 2022-09-09 ENCOUNTER — Emergency Department: Payer: Medicare Other

## 2022-09-09 ENCOUNTER — Inpatient Hospital Stay
Admission: EM | Admit: 2022-09-09 | Discharge: 2022-09-11 | DRG: 177 | Disposition: A | Discharge status: Home / Self Care | Payer: Medicare Other | Attending: Internal Medicine | Admitting: Internal Medicine

## 2022-09-09 ENCOUNTER — Other Ambulatory Visit: Payer: Self-pay

## 2022-09-09 ENCOUNTER — Encounter: Payer: Self-pay | Admitting: Radiology

## 2022-09-09 DIAGNOSIS — I482 Chronic atrial fibrillation, unspecified: Secondary | ICD-10-CM | POA: Diagnosis present

## 2022-09-09 DIAGNOSIS — Z7901 Long term (current) use of anticoagulants: Secondary | ICD-10-CM | POA: Diagnosis not present

## 2022-09-09 DIAGNOSIS — N179 Acute kidney failure, unspecified: Secondary | ICD-10-CM | POA: Diagnosis present

## 2022-09-09 DIAGNOSIS — F17210 Nicotine dependence, cigarettes, uncomplicated: Secondary | ICD-10-CM | POA: Diagnosis present

## 2022-09-09 DIAGNOSIS — C61 Malignant neoplasm of prostate: Secondary | ICD-10-CM | POA: Diagnosis present

## 2022-09-09 DIAGNOSIS — I480 Paroxysmal atrial fibrillation: Secondary | ICD-10-CM | POA: Diagnosis present

## 2022-09-09 DIAGNOSIS — N1831 Chronic kidney disease, stage 3a: Secondary | ICD-10-CM | POA: Diagnosis present

## 2022-09-09 DIAGNOSIS — N400 Enlarged prostate without lower urinary tract symptoms: Secondary | ICD-10-CM | POA: Diagnosis present

## 2022-09-09 DIAGNOSIS — J1282 Pneumonia due to coronavirus disease 2019: Secondary | ICD-10-CM | POA: Diagnosis present

## 2022-09-09 DIAGNOSIS — N390 Urinary tract infection, site not specified: Secondary | ICD-10-CM | POA: Diagnosis present

## 2022-09-09 DIAGNOSIS — Z8249 Family history of ischemic heart disease and other diseases of the circulatory system: Secondary | ICD-10-CM

## 2022-09-09 DIAGNOSIS — R7401 Elevation of levels of liver transaminase levels: Secondary | ICD-10-CM | POA: Diagnosis present

## 2022-09-09 DIAGNOSIS — R531 Weakness: Secondary | ICD-10-CM

## 2022-09-09 DIAGNOSIS — Z79899 Other long term (current) drug therapy: Secondary | ICD-10-CM

## 2022-09-09 DIAGNOSIS — R6520 Severe sepsis without septic shock: Secondary | ICD-10-CM | POA: Diagnosis not present

## 2022-09-09 DIAGNOSIS — E782 Mixed hyperlipidemia: Secondary | ICD-10-CM | POA: Diagnosis present

## 2022-09-09 DIAGNOSIS — R7989 Other specified abnormal findings of blood chemistry: Secondary | ICD-10-CM | POA: Insufficient documentation

## 2022-09-09 DIAGNOSIS — I10 Essential (primary) hypertension: Secondary | ICD-10-CM | POA: Diagnosis present

## 2022-09-09 DIAGNOSIS — I129 Hypertensive chronic kidney disease with stage 1 through stage 4 chronic kidney disease, or unspecified chronic kidney disease: Secondary | ICD-10-CM | POA: Diagnosis present

## 2022-09-09 DIAGNOSIS — K529 Noninfective gastroenteritis and colitis, unspecified: Secondary | ICD-10-CM | POA: Diagnosis present

## 2022-09-09 DIAGNOSIS — D649 Anemia, unspecified: Secondary | ICD-10-CM | POA: Diagnosis present

## 2022-09-09 DIAGNOSIS — I4891 Unspecified atrial fibrillation: Secondary | ICD-10-CM | POA: Diagnosis not present

## 2022-09-09 DIAGNOSIS — I2510 Atherosclerotic heart disease of native coronary artery without angina pectoris: Secondary | ICD-10-CM | POA: Diagnosis present

## 2022-09-09 DIAGNOSIS — U071 COVID-19: Secondary | ICD-10-CM | POA: Diagnosis not present

## 2022-09-09 DIAGNOSIS — I214 Non-ST elevation (NSTEMI) myocardial infarction: Secondary | ICD-10-CM | POA: Diagnosis present

## 2022-09-09 DIAGNOSIS — A419 Sepsis, unspecified organism: Principal | ICD-10-CM | POA: Diagnosis present

## 2022-09-09 LAB — HEPATIC FUNCTION PANEL
ALT: 48 U/L — ABNORMAL HIGH (ref 0–44)
AST: 75 U/L — ABNORMAL HIGH (ref 15–41)
Albumin: 3.3 g/dL — ABNORMAL LOW (ref 3.5–5.0)
Alkaline Phosphatase: 80 U/L (ref 38–126)
Bilirubin, Direct: 0.3 mg/dL — ABNORMAL HIGH (ref 0.0–0.2)
Indirect Bilirubin: 0.6 mg/dL (ref 0.3–0.9)
Total Bilirubin: 0.9 mg/dL (ref 0.3–1.2)
Total Protein: 6.7 g/dL (ref 6.5–8.1)

## 2022-09-09 LAB — BASIC METABOLIC PANEL
Anion gap: 9 (ref 5–15)
BUN: 31 mg/dL — ABNORMAL HIGH (ref 8–23)
CO2: 21 mmol/L — ABNORMAL LOW (ref 22–32)
Calcium: 8.4 mg/dL — ABNORMAL LOW (ref 8.9–10.3)
Chloride: 108 mmol/L (ref 98–111)
Creatinine, Ser: 1.45 mg/dL — ABNORMAL HIGH (ref 0.61–1.24)
GFR, Estimated: 46 mL/min — ABNORMAL LOW (ref >60)
Glucose, Bld: 147 mg/dL — ABNORMAL HIGH (ref 70–99)
Potassium: 4.1 mmol/L (ref 3.5–5.1)
Sodium: 138 mmol/L (ref 135–145)

## 2022-09-09 LAB — CBC
HCT: 35.3 % — ABNORMAL LOW (ref 39.0–52.0)
Hemoglobin: 11.3 g/dL — ABNORMAL LOW (ref 13.0–17.0)
MCH: 28.5 pg (ref 26.0–34.0)
MCHC: 32 g/dL (ref 30.0–36.0)
MCV: 88.9 fL (ref 80.0–100.0)
Platelets: 226 10*3/uL (ref 150–400)
RBC: 3.97 MIL/uL — ABNORMAL LOW (ref 4.22–5.81)
RDW: 13.9 % (ref 11.5–15.5)
WBC: 5.3 10*3/uL (ref 4.0–10.5)
nRBC: 0 % (ref 0.0–0.2)

## 2022-09-09 LAB — LACTIC ACID, PLASMA
Lactic Acid, Venous: 1.3 mmol/L (ref 0.5–1.9)
Lactic Acid, Venous: 1.3 mmol/L (ref 0.5–1.9)

## 2022-09-09 LAB — URINALYSIS, ROUTINE W REFLEX MICROSCOPIC
Bilirubin Urine: NEGATIVE
Glucose, UA: NEGATIVE mg/dL
Ketones, ur: NEGATIVE mg/dL
Nitrite: POSITIVE — AB
Protein, ur: 30 mg/dL — AB
Specific Gravity, Urine: 1.031 — ABNORMAL HIGH (ref 1.005–1.030)
WBC, UA: >50 WBC/hpf — ABNORMAL HIGH (ref 0–5)
pH: 5 (ref 5.0–8.0)

## 2022-09-09 LAB — RESPIRATORY PANEL BY PCR

## 2022-09-09 LAB — TROPONIN I (HIGH SENSITIVITY)
Troponin I (High Sensitivity): 1103 ng/L (ref <18)
Troponin I (High Sensitivity): 2961 ng/L (ref <18)
Troponin I (High Sensitivity): 5298 ng/L (ref <18)
Troponin I (High Sensitivity): 6580 ng/L (ref <18)

## 2022-09-09 LAB — MAGNESIUM
Magnesium: 2.1 mg/dL (ref 1.7–2.4)
Magnesium: 2 mg/dL (ref 1.7–2.4)

## 2022-09-09 LAB — HEPARIN LEVEL (UNFRACTIONATED): Heparin Unfractionated: >1.1 IU/mL — ABNORMAL HIGH (ref 0.30–0.70)

## 2022-09-09 LAB — RESP PANEL BY RT-PCR (RSV, FLU A&B, COVID)  RVPGX2
Influenza A by PCR: NEGATIVE
Influenza B by PCR: NEGATIVE
Resp Syncytial Virus by PCR: NEGATIVE
SARS Coronavirus 2 by RT PCR: POSITIVE — AB

## 2022-09-09 LAB — TSH: TSH: 2.182 u[IU]/mL (ref 0.350–4.500)

## 2022-09-09 LAB — MRSA NEXT GEN BY PCR, NASAL: MRSA by PCR Next Gen: NOT DETECTED

## 2022-09-09 LAB — PROTIME-INR
INR: 1.2 (ref 0.8–1.2)
Prothrombin Time: 15.5 seconds — ABNORMAL HIGH (ref 11.4–15.2)

## 2022-09-09 LAB — PHOSPHORUS: Phosphorus: 2.9 mg/dL (ref 2.5–4.6)

## 2022-09-09 LAB — T4, FREE: Free T4: 1.5 ng/dL — ABNORMAL HIGH (ref 0.61–1.12)

## 2022-09-09 LAB — APTT: aPTT: 40 seconds — ABNORMAL HIGH (ref 24–36)

## 2022-09-09 LAB — PROCALCITONIN: Procalcitonin: <0.1 ng/mL

## 2022-09-09 MED ORDER — SODIUM CHLORIDE 0.9 % IV SOLN
2.0000 g | INTRAVENOUS | Status: DC
  Administered 2022-09-10: 2 g via INTRAVENOUS
  Filled 2022-09-09: qty 20

## 2022-09-09 MED ORDER — HEPARIN BOLUS VIA INFUSION
4000.0000 [IU] | Freq: Once | INTRAVENOUS | Status: AC
  Administered 2022-09-09: 4000 [IU] via INTRAVENOUS
  Filled 2022-09-09: qty 4000

## 2022-09-09 MED ORDER — ACETAMINOPHEN 325 MG RE SUPP: 650.0000 mg | Freq: Four times a day (QID) | RECTAL | Status: DC | PRN

## 2022-09-09 MED ORDER — ONDANSETRON HCL 4 MG/2ML IJ SOLN: 4.0000 mg | Freq: Four times a day (QID) | INTRAMUSCULAR | Status: DC | PRN

## 2022-09-09 MED ORDER — LOPERAMIDE HCL 2 MG PO CAPS: 2.0000 mg | ORAL_CAPSULE | ORAL | Status: AC | PRN

## 2022-09-09 MED ORDER — ACETAMINOPHEN 325 MG PO TABS: 650.0000 mg | ORAL_TABLET | Freq: Four times a day (QID) | ORAL | Status: DC | PRN

## 2022-09-09 MED ORDER — METOPROLOL SUCCINATE ER 50 MG PO TB24
50.0000 mg | ORAL_TABLET | Freq: Every day | ORAL | Status: DC
  Administered 2022-09-10 – 2022-09-11 (×2): 50 mg via ORAL
  Filled 2022-09-09 (×3): qty 1

## 2022-09-09 MED ORDER — DILTIAZEM HCL 25 MG/5ML IV SOLN
10.0000 mg | Freq: Once | INTRAVENOUS | Status: AC
  Administered 2022-09-09: 10 mg via INTRAVENOUS
  Filled 2022-09-09: qty 5

## 2022-09-09 MED ORDER — SODIUM CHLORIDE 0.9 % IV SOLN
500.0000 mg | Freq: Once | INTRAVENOUS | Status: AC
  Administered 2022-09-09: 500 mg via INTRAVENOUS
  Filled 2022-09-09: qty 5

## 2022-09-09 MED ORDER — HALOPERIDOL LACTATE 5 MG/ML IJ SOLN: 2.0000 mg | Freq: Four times a day (QID) | INTRAMUSCULAR | Status: DC | PRN

## 2022-09-09 MED ORDER — AMIODARONE HCL 200 MG PO TABS
200.0000 mg | ORAL_TABLET | Freq: Every day | ORAL | Status: DC
  Administered 2022-09-09 – 2022-09-11 (×3): 200 mg via ORAL
  Filled 2022-09-09 (×3): qty 1

## 2022-09-09 MED ORDER — SODIUM CHLORIDE 0.9 % IV SOLN
2.0000 g | Freq: Once | INTRAVENOUS | Status: AC
  Administered 2022-09-09: 2 g via INTRAVENOUS
  Filled 2022-09-09: qty 20

## 2022-09-09 MED ORDER — SENNOSIDES-DOCUSATE SODIUM 8.6-50 MG PO TABS: 1.0000 | ORAL_TABLET | Freq: Every evening | ORAL | Status: DC | PRN

## 2022-09-09 MED ORDER — ONDANSETRON HCL 4 MG PO TABS: 4.0000 mg | ORAL_TABLET | Freq: Four times a day (QID) | ORAL | Status: DC | PRN

## 2022-09-09 MED ORDER — SODIUM CHLORIDE 0.9 % IV SOLN: 500.0000 mg | INTRAVENOUS | Status: DC

## 2022-09-09 MED ORDER — ONDANSETRON HCL 4 MG/2ML IJ SOLN
4.0000 mg | Freq: Once | INTRAMUSCULAR | Status: AC
  Administered 2022-09-09: 4 mg via INTRAVENOUS
  Filled 2022-09-09: qty 2

## 2022-09-09 MED ORDER — LACTATED RINGERS IV BOLUS
1000.0000 mL | Freq: Once | INTRAVENOUS | Status: AC
  Administered 2022-09-09: 1000 mL via INTRAVENOUS

## 2022-09-09 MED ORDER — METOPROLOL TARTRATE 5 MG/5ML IV SOLN: 5.0000 mg | Freq: Once | INTRAVENOUS | Status: DC

## 2022-09-09 MED ORDER — NITROGLYCERIN 0.4 MG SL SUBL: 0.4000 mg | SUBLINGUAL_TABLET | SUBLINGUAL | Status: DC | PRN

## 2022-09-09 MED ORDER — HEPARIN (PORCINE) 25000 UT/250ML-% IV SOLN
700.0000 [IU]/h | INTRAVENOUS | Status: DC
  Administered 2022-09-09: 850 [IU]/h via INTRAVENOUS
  Filled 2022-09-09: qty 250

## 2022-09-09 MED ORDER — DILTIAZEM HCL-DEXTROSE 125-5 MG/125ML-% IV SOLN (PREMIX)
5.0000 mg/h | INTRAVENOUS | Status: DC
  Administered 2022-09-09: 5 mg/h via INTRAVENOUS
  Administered 2022-09-10: 15 mg/h via INTRAVENOUS
  Filled 2022-09-09 (×3): qty 125

## 2022-09-09 MED ORDER — RELUGOLIX 120 MG PO TABS: 120.0000 mg | ORAL_TABLET | Freq: Every day | ORAL | Status: DC

## 2022-09-09 MED ORDER — IOHEXOL 350 MG/ML SOLN
75.0000 mL | Freq: Once | INTRAVENOUS | Status: AC | PRN
  Administered 2022-09-09: 75 mL via INTRAVENOUS

## 2022-09-09 MED ORDER — HALOPERIDOL LACTATE 5 MG/ML IJ SOLN: 2.0000 mg | Freq: Once | INTRAMUSCULAR | Status: DC

## 2022-09-09 MED ORDER — SODIUM CHLORIDE 0.9 % IV BOLUS
500.0000 mL | Freq: Once | INTRAVENOUS | Status: AC
  Administered 2022-09-09: 500 mL via INTRAVENOUS

## 2022-09-09 MED ORDER — ASPIRIN 81 MG PO CHEW
324.0000 mg | CHEWABLE_TABLET | Freq: Once | ORAL | Status: AC
  Administered 2022-09-09: 324 mg via ORAL
  Filled 2022-09-09: qty 4

## 2022-09-09 NOTE — Assessment & Plan Note (Addendum)
With gastroenteritis, diarrhea - Continue symptomatic support and treat multifocal pneumonia - Loperamide 2 mg p.o. as needed for diarrhea/loose stools, 12 hours ordered

## 2022-09-09 NOTE — Consult Note (Signed)
ANTICOAGULATION CONSULT NOTE  Pharmacy Consult for Heparin Indication: chest pain/ACS  No Known Allergies  Patient Measurements: Height: '5\' 8"'$  (172.7 cm) Weight: 70.3 kg (154 lb 15.7 oz) IBW/kg (Calculated) : 68.4 Heparin Dosing Weight: 70.3 kg  Vital Signs: Temp: 98 F (36.7 C) (01/08 1522) Temp Source: Oral (01/08 1522) BP: 110/80 (01/08 1330) Pulse Rate: 55 (01/08 1330)  Labs: Recent Labs    09/09/22 1055 09/09/22 1518  HGB 11.3*  --   HCT 35.3*  --   PLT 226  --   APTT  --  40*  LABPROT  --  15.5*  INR  --  1.2  HEPARINUNFRC  --  >1.10*  CREATININE 1.45*  --   TROPONINIHS 1,103* 2,961*    Estimated Creatinine Clearance: 32.8 mL/min (A) (by C-G formula based on SCr of 1.45 mg/dL (H)).   Medical History: Past Medical History:  Diagnosis Date   Atrial fibrillation (HCC)    BPH (benign prostatic hyperplasia)    HLD (hyperlipidemia)    Hypertension    Mitral valve disease    Pneumonia     Medications:  PTA: Apixaban '5mg'$  BID. Last dose: 09/08/22@~2200  Assessment: 89 yo male with PMH of HLD, Atrial fibrillation(on Eliquis PTA) and CAD presented to the ED with chief concerns of weakness, nausea, vomiting, and diarrhea. While in the ED, patient had increased heart rate, elevated troponin levels, and was found to be in atrial fibrillation with RVR. Patient's last Apixaban dose was on 09/08/22@~2200. Pharmacy team was consulted to monitor and titrate heparin infusion while admitted.  Baseline labs: HL>1.10, aPTT 40sec, Hgb 11.3, Plts 226  Goal of Therapy:  Heparin level 0.3-0.7 units/ml aPTT: 66-102 seconds Monitor platelets by anticoagulation protocol: Yes   Plan:  Give 4000 units bolus x 1 Start heparin infusion at 850 units/hr Check aPTT level in 8 hours and HL daily while on heparin. Will transition to HL dosing once both levels correlate. Continue to monitor H&H and platelets  Pearla Dubonnet 09/09/2022,4:24 PM

## 2022-09-09 NOTE — H&P (Addendum)
History and Physical   Evan Ramirez WLS:[id number] DOB: 25-Dec-1929 DOA: 09/09/2022  PCP: Evan Castle, MD  Patient coming from: Home  I have personally briefly reviewed patient's old medical records in [place].  Chief Concern: Generalized weakness  HPI: Mr. Evan Ramirez is a 89 year old male with history of hyperlipidemia, atrial fibrillation, coronary artery disease, who presents emergency department for chief concerns of progressively worsening weakness, nausea, vomiting, diarrhea.  Initial vitals in the emergency department showed temperature of 97.5, respiration rate of 19, heart rate of 134, blood pressure 105/85, SpO2 of 98% on room air.  Serum sodium is 138, potassium 4.1, chloride 108, bicarb 21, BUN of 31, serum creatinine of 1.45, EGFR 46, nonfasting blood glucose 147, WBC 5.3, hemoglobin 11.3, platelets of 226.  High sensitive troponin was elevated at 1103.  Patient tested positive for COVID-19.  CTA chest for PE, was read as: No definite evidence of large central PE however evaluation of lower lobe branches of pulmonary artery is extremely limited.  Patchy airspace opacity noted in the right upper and lower lobes as well as left basilar region, concerning for multifocal pneumonia.  ED treatment: Aspirin 324 mg p.o. one-time dose, diltiazem gtt., ondansetron 4 mg IV, azithromycin 500 mg IV once, ceftriaxone 2 g IV once.  Blood cultures x 2 has been ordered by EDP and at the time of this dictation is pending collection. ------------------------ At bedside patient was able to tell me his name, his age, he knows he is in the hospital and he knows the current calendar year.  He knows that his son Evan Ramirez is at bedside.  He reports that over the last 1 week he has been having increased weakness that is generalized.  He endorses persistent nausea and denies vomiting.  He endorses diarrhea daily for the last week.  He denies blood in his stool.    He denies dysuria,  hematuria, syncope, loss of consciousness, chest pain, shortness of breath, abdominal pain.  He denies known sick contacts.  He reports the diarrhea has improved and his last episode was yesterday on 09/08/2022.  He reports no diarrhea today.  Social history: He lives at home with his wife.  ROS: Constitutional: no weight change, no fever ENT/Mouth: no sore throat, no rhinorrhea Eyes: no eye pain, no vision changes Cardiovascular: no chest pain, no dyspnea,  no edema, no palpitations Respiratory: no cough, no sputum, no wheezing Gastrointestinal: + nausea, no vomiting, + diarrhea, no constipation Genitourinary: no urinary incontinence, no dysuria, no hematuria Musculoskeletal: no arthralgias, no myalgias Skin: no skin lesions, no pruritus, Neuro: + weakness, no loss of consciousness, no syncope Psych: no anxiety, no depression, + decrease appetite Heme/Lymph: no bruising, no bleeding  ED Course: Discussed with emergency medicine provider, patient requiring hospitalization for chief concerns of multifocal pneumonia.  Assessment/Plan  Principal Problem:   Severe sepsis with acute organ dysfunction (HCC) Active Problems:   BPH (benign prostatic hyperplasia)   Mixed hyperlipidemia   Hypertension   Atrial fibrillation, chronic (HCC)   Elevated troponin   Chronic atrial fibrillation with RVR ([place])   COVID-19 virus detected   Assessment and Plan:  * Severe sepsis with acute organ dysfunction (HCC) Multifocal pneumonia - Patient has increased heart rate, source is pneumonia, organ involvement is cardiac with elevated troponin and atrial fibrillation with RVR - Sepsis not ruled out at this time - Etiology workup in progress - Check lactic acid x 2, MRSA PCR, 20 pathogen respiratory panel - Patient is  maintaining appropriate MAP, however blood pressure is downtrending - One-time dose of sodium chloride 500 mL bolus ordered on admission - Blood cultures x 2, UA, urine culture have  been ordered and pending collection - Agree with azithromycin 500 mg IV daily and ceftriaxone 2 g IV daily, these have been ordered for 09/10/2022 - Admit to inpatient, progressive  COVID-19 virus detected With gastroenteritis, diarrhea - Continue symptomatic support and treat multifocal pneumonia - Loperamide 2 mg p.o. as needed for diarrhea/loose stools, 12 hours ordered  Chronic atrial fibrillation with RVR ([place]) - Additional diltiazem 10 mg IV one-time dose ordered - Continue diltiazem gtt. - Resumed Home amiodarone 200 mg daily and metoprolol succinate 50 mg daily - Continue telemetry monitoring in PCU  Elevated troponin - I suspect this is secondary to demand ischemia as patient is not endorsing chest pain - We will continue to follow - Message nursing staff that second high sensitive troponin is due for collection - Continue heparin GTT  Addendum: Troponin continues to remain elevated with positive delta - Complete echo ordered - Continue to trend high sensitive troponin - Cardiology has been consulted via secure chat and epic order, to [facility] on call cardiologist, Dr. Clayborn Bigness - Continue heparin GTT  Atrial fibrillation, chronic ([place]) - Continue Dilt gtt. - Patient's last dose of Eliquis was on 09/08/2022, a.m. team to resume as appropriate - Heparin GTT - Resumed home amiodarone 200 mg daily, metoprolol succinate 50 mg daily  Hypertension - Amlodipine 5 mg daily not resumed on admission - Lisinopril-hydrochlorothiazide not resumed on admission due to possible acute kidney injury/elevated serum creatinine - AM team to resume when appropriate  Prostate cancer-resumed home Relugolix  Chart reviewed.   DVT prophylaxis: Heparin GTT Code Status: Full code Diet: Heart healthy Family Communication: Updated son, Evan Ramirez at bedside with patient's permission Disposition Plan: Pending clinical course Consults called: Pharmacy Admission status: Inpatient, progressive  cardiac  Past Medical History:  Diagnosis Date   Atrial fibrillation ([place])    BPH (benign prostatic hyperplasia)    HLD (hyperlipidemia)    Hypertension    Mitral valve disease    Pneumonia    Past Surgical History:  Procedure Laterality Date   CATARACT EXTRACTION     ear drum surgery     INGUINAL HERNIA REPAIR Right 07/07/2020   Procedure: HERNIA REPAIR INGUINAL ADULT;  Surgeon: Benjamine Sprague, DO;  Location: [facility] ORS;  Service: General;  Laterality: Right;   PROSTATE BIOPSY N/A 09/14/2020   Procedure: PROSTATE BIOPSY URONAV PROSTATE;  Surgeon: Royston Cowper, MD;  Location: [facility] ORS;  Service: Urology;  Laterality: N/A;   TONSILLECTOMY AND ADENOIDECTOMY     Social History:  reports that he has been smoking cigarettes. He has a 60.00 pack-year smoking history. He has never used smokeless tobacco. He reports that he does not drink alcohol and does not use drugs.  No Known Allergies Family History  Problem Relation Age of Onset   Parkinson's disease Mother    CVA Father    CAD Father    Family history: Family history reviewed and not pertinent.  Prior to Admission medications   Medication Sig Start Date End Date Taking? Authorizing Provider  amiodarone (PACERONE) 200 MG tablet Take 1 tablet (200 mg total) by mouth daily. 09/01/18  Yes Sudini, Alveta Heimlich, MD  amLODipine (NORVASC) 5 MG tablet Take 5 mg by mouth daily.   Yes [provider]  apixaban (ELIQUIS) 5 MG TABS tablet Take 1 tablet (5 mg total) by  mouth 2 (two) times daily. 09/01/18  Yes Hillary Bow, MD  ibuprofen (ADVIL) 800 MG tablet Take 1 tablet (800 mg total) by mouth every 8 (eight) hours as needed for mild pain or moderate pain. 07/07/20  Yes Sakai, Isami, DO  lisinopril-hydrochlorothiazide (PRINZIDE,ZESTORETIC) 20-12.5 MG tablet Take 2 tablets by mouth daily.  07/21/18 09/09/22 Yes [provider]  metoprolol succinate (TOPROL-XL) 50 MG 24 hr tablet Take 1 tablet by mouth daily. 07/23/22 07/23/23 Yes  [provider]  Relugolix (ORGOVYX) 120 MG TABS Take 120 mg by mouth daily. Pt gets from [employer]   Yes [provider]  finasteride (PROSCAR) 5 MG tablet Take 5 mg by mouth daily. Patient not taking: Reported on 09/09/2022 07/12/20   [provider]  HYDROcodone-acetaminophen (NORCO) 5-325 MG tablet Take 1 tablet by mouth every 6 (six) hours as needed for up to 6 doses for moderate pain. Patient not taking: Reported on 08/29/2020 07/07/20   Benjamine Sprague, DO  levofloxacin (LEVAQUIN) 500 MG tablet Take 1 tablet (500 mg total) by mouth daily. Patient not taking: Reported on 09/09/2022 09/14/20   Royston Cowper, MD  tamsulosin (FLOMAX) 0.4 MG CAPS capsule Take 0.4 mg by mouth daily. Patient not taking: Reported on 09/09/2022 07/12/20   [provider]   Physical Exam: Vitals:   09/09/22 1925 09/09/22 1930 09/09/22 2000 09/09/22 2030  BP:  111/64 103/62 110/72  Pulse:  70 77 78  Resp:  (!) 21 (!) 24 (!) 25  Temp: 98.1 F (36.7 C)     TempSrc: Oral     SpO2:  94% 97% 96%  Weight:      Height:       Constitutional: appears age-appropriate, NAD, calm, comfortable Eyes: PERRL, lids and conjunctivae normal ENMT: Mucous membranes are moist. Posterior pharynx clear of any exudate or lesions. Age-appropriate dentition. Hearing appropriate Neck: normal, supple, no masses, no thyromegaly Respiratory: clear to auscultation bilaterally, no wheezing, no crackles. Normal respiratory effort. No accessory muscle use.  Cardiovascular: Regular rate and rhythm, no murmurs / rubs / gallops. No extremity edema. 2+ pedal pulses. No carotid bruits.  Abdomen: no tenderness, no masses palpated, no hepatosplenomegaly. Bowel sounds positive.  Musculoskeletal: no clubbing / cyanosis. No joint deformity upper and lower extremities. Good ROM, no contractures, no atrophy. Normal muscle tone.  Skin: no rashes, lesions, ulcers. No induration Neurologic: Sensation intact. Strength 5/5 in  all 4.  Psychiatric: Normal judgment and insight. Alert and oriented x 3. Normal mood.   EKG: independently reviewed, showing atrial fibrillation with rate of 152, QTc 457  Chest x-ray on Admission: I personally reviewed and I agree with radiologist reading as below.  CT Angio Chest Pulmonary Embolism (PE) W or WO Contrast  Result Date: 09/09/2022 CLINICAL DATA:  Progressive weakness. EXAM: CT ANGIOGRAPHY CHEST WITH CONTRAST TECHNIQUE: Multidetector CT imaging of the chest was performed using the standard protocol during bolus administration of intravenous contrast. Multiplanar CT image reconstructions and MIPs were obtained to evaluate the vascular anatomy. RADIATION DOSE REDUCTION: This exam was performed according to the departmental dose-optimization program which includes automated exposure control, adjustment of the mA and/or kV according to patient size and/or use of iterative reconstruction technique. CONTRAST:  59m OMNIPAQUE IOHEXOL 350 MG/ML SOLN COMPARISON:  None Available. FINDINGS: Cardiovascular: There is no evidence of large central pulmonary embolus seen in the main pulmonary artery or the main portions of the left and right pulmonary arteries as well as in the upper lobe branches. However,  there is very limited contrast opacification of the lower lobe branches most likely due to timing of contrast bolus as well as some degree of respiratory motion artifact. Therefore, smaller and more peripheral pulmonary emboli in the lower lobe branches of the pulmonary arteries cannot be excluded. No pericardial effusion. Coronary artery calcifications are noted. Atherosclerosis of thoracic aorta is noted without aneurysm formation. Mediastinum/Nodes: No enlarged mediastinal, hilar, or axillary lymph nodes. Thyroid gland, trachea, and esophagus demonstrate no significant findings. Lungs/Pleura: No pneumothorax or pleural effusion is noted. Patchy airspace opacities are noted in the right upper and lower  lobes as well as in left basilar region, concerning for multifocal pneumonia. Bronchial wall thickening is noted in the lower lobes suggesting some degree of bronchitis. Upper Abdomen: No acute abnormality. Musculoskeletal: No chest wall abnormality. No acute or significant osseous findings. Review of the MIP images confirms the above findings. IMPRESSION: There is no definite evidence of large central pulmonary embolus seen in the main pulmonary artery or the main portions of the left and right pulmonary arteries. However, evaluation of the lower lobe branches of both pulmonary arteries is extremely limited due to limited contrast opacification most likely due to timing of contrast bolus as well as some degree of respiratory motion artifact. Therefore, smaller and more peripheral pulmonary emboli cannot be excluded on the basis of this exam. Patchy airspace opacities are noted in the right upper and lower lobes as well as in the left basilar region, concerning for multifocal pneumonia. Bronchial wall thickening is noted in both lower lobes suggesting some degree of bronchitis. Coronary artery calcifications are noted suggesting coronary artery disease. Aortic Atherosclerosis (ICD10-I70.0). Electronically Signed   By: Marijo Conception M.D.   On: 09/09/2022 14:19   DG Chest 2 View  Result Date: 09/09/2022 CLINICAL DATA:  Shortness of breath. Weakness and dry cough for a few days. EXAM: CHEST - 2 VIEW COMPARISON:  Chest two views 08/30/2018 FINDINGS: Cardiac silhouette and mediastinal contours are within limits. Moderate calcification within the aortic arch. Resolution of prior mild interstitial thickening. Overall improved aeration without definite acute airspace opacity seen. No pleural effusion pneumothorax. Mild-to-moderate multilevel degenerative disc changes of the thoracic spine. IMPRESSION: Resolution of prior mild interstitial thickening. Overall improved aeration without definite acute airspace opacity  seen. Electronically Signed   By: Yvonne Kendall M.D.   On: 09/09/2022 11:48    Labs on Admission: I have personally reviewed following labs  CBC: Recent Labs  Lab 09/09/22 1055  WBC 5.3  HGB 11.3*  HCT 35.3*  MCV 88.9  PLT 937   Basic Metabolic Panel: Recent Labs  Lab 09/09/22 1055 09/09/22 1518  NA 138  --   K 4.1  --   CL 108  --   CO2 21*  --   GLUCOSE 147*  --   BUN 31*  --   CREATININE 1.45*  --   CALCIUM 8.4*  --   MG 2.1 2.0  PHOS  --  2.9   GFR: Estimated Creatinine Clearance: 32.8 mL/min (A) (by C-G formula based on SCr of 1.45 mg/dL (H)).  Liver Function Tests: Recent Labs  Lab 09/09/22 1055  AST 75*  ALT 48*  ALKPHOS 80  BILITOT 0.9  PROT 6.7  ALBUMIN 3.3*   Thyroid Function Tests: Recent Labs    09/09/22 1055  TSH 2.182  FREET4 1.50*   Urine analysis:    Component Value Date/Time   COLORURINE YELLOW (A) 09/09/2022 1550   APPEARANCEUR HAZY (A)  09/09/2022 1550   APPEARANCEUR Clear 06/13/2016 0843   LABSPEC 1.031 (H) 09/09/2022 1550   PHURINE 5.0 09/09/2022 1550   GLUCOSEU NEGATIVE 09/09/2022 1550   HGBUR MODERATE (A) 09/09/2022 1550   BILIRUBINUR NEGATIVE 09/09/2022 1550   BILIRUBINUR Negative 06/13/2016 0843   KETONESUR NEGATIVE 09/09/2022 1550   PROTEINUR 30 (A) 09/09/2022 1550   NITRITE POSITIVE (A) 09/09/2022 1550   LEUKOCYTESUR LARGE (A) 09/09/2022 1550   CRITICAL CARE Performed by: Dr. Tobie Poet  Total critical care time: 35 minutes  Critical care time was exclusive of separately billable procedures and treating other patients.  Critical care was necessary to treat or prevent imminent or life-threatening deterioration.  Critical care was time spent personally by me on the following activities: development of treatment plan with patient and/or surrogate as well as nursing, discussions with consultants, evaluation of patient's response to treatment, examination of patient, obtaining history from patient or surrogate, ordering and  performing treatments and interventions, ordering and review of laboratory studies, ordering and review of radiographic studies, pulse oximetry and re-evaluation of patient's condition.  This document was prepared using Dragon Voice Recognition software and may include unintentional dictation errors.  Dr. Tobie Poet [facility]  If 7PM-7AM, please contact overnight-coverage provider If 7AM-7PM, please contact day coverage provider [email or web address]  09/09/2022, 9:03 PM

## 2022-09-09 NOTE — Hospital Course (Signed)
Evan Ramirez is a 89 year old male with history of hyperlipidemia, atrial fibrillation, coronary artery disease, who presents emergency department for chief concerns of progressively worsening weakness, nausea, vomiting, diarrhea.  Initial vitals in the emergency department showed temperature of 97.5, respiration rate of 19, heart rate of 134, blood pressure 105/85, SpO2 of 98% on room air.  Serum sodium is 138, potassium 4.1, chloride 108, bicarb 21, BUN of 31, serum creatinine of 1.45, EGFR 46, nonfasting blood glucose 147, WBC 5.3, hemoglobin 11.3, platelets of 226.  High sensitive troponin was elevated at 1103.  Patient tested positive for COVID-19.  CTA chest for PE, was read as: No definite evidence of large central PE however evaluation of lower lobe branches of pulmonary artery is extremely limited.  Patchy airspace opacity noted in the right upper and lower lobes as well as left basilar region, concerning for multifocal pneumonia.  ED treatment: Aspirin 324 mg p.o. one-time dose, diltiazem gtt., ondansetron 4 mg IV, azithromycin 500 mg IV once, ceftriaxone 2 g IV once.  Blood cultures x 2 has been ordered by EDP and at the time of this dictation is pending collection.

## 2022-09-09 NOTE — Assessment & Plan Note (Addendum)
Multifocal pneumonia - Patient has increased heart rate, source is pneumonia, organ involvement is cardiac with elevated troponin and atrial fibrillation with RVR - Sepsis not ruled out at this time - Etiology workup in progress - Check lactic acid x 2, MRSA PCR, 20 pathogen respiratory panel - Patient is maintaining appropriate MAP, however blood pressure is downtrending - One-time dose of sodium chloride 500 mL bolus ordered on admission - Blood cultures x 2, UA, urine culture have been ordered and pending collection - Agree with azithromycin 500 mg IV daily and ceftriaxone 2 g IV daily, these have been ordered for 09/10/2022 - Admit to inpatient, progressive

## 2022-09-09 NOTE — Assessment & Plan Note (Addendum)
-   Continue Dilt gtt. - Patient's last dose of Eliquis was on 09/08/2022, a.m. team to resume as appropriate - Heparin GTT - Resumed home amiodarone 200 mg daily, metoprolol succinate 50 mg daily

## 2022-09-09 NOTE — ED Notes (Signed)
Starleen Blue, MD, made aware of troponin 1,103

## 2022-09-09 NOTE — Assessment & Plan Note (Signed)
-   Additional diltiazem 10 mg IV one-time dose ordered - Continue diltiazem gtt. - Resumed Home amiodarone 200 mg daily and metoprolol succinate 50 mg daily - Continue telemetry monitoring in PCU

## 2022-09-09 NOTE — ED Triage Notes (Signed)
Pt here via [facility] with weakness for a cew days. Pt also having diarrhea. Pt has not been eating or drinking. Pt denies SOB or CP. Pt has hx of a-fib and prostate CA.  98% RA 208-cbg 20G LAC

## 2022-09-09 NOTE — Assessment & Plan Note (Addendum)
-   Amlodipine 5 mg daily not resumed on admission - Lisinopril-hydrochlorothiazide not resumed on admission due to possible acute kidney injury/elevated serum creatinine - AM team to resume when appropriate

## 2022-09-09 NOTE — ED Provider Notes (Signed)
[facility] Provider Note    Event Date/Time   First MD Initiated Contact with Patient 09/09/22 1055     (approximate)   History   Weakness   HPI  Evan Ramirez is a 89 y.o. male with past medical history of nonvalvular paroxysmal A-fib, hypertension, HLD who presents with weakness.  Patient notes that since Christmas he has been feeling very weak.  He started with vomiting and diarrhea and has had ongoing nausea since with minimal p.o. intake.  Still is able to tolerate medications and is no longer vomiting or having diarrhea.  He feels very weak.  Heart rates have been fluctuating into the 130s.  Denies chest pain dyspnea has had some cough.  No fevers chills.  No sick contacts.  Denies abdominal pain only nausea.     Past Medical History:  Diagnosis Date   Atrial fibrillation (HCC)    BPH (benign prostatic hyperplasia)    HLD (hyperlipidemia)    Hypertension    Mitral valve disease    Pneumonia     Patient Active Problem List   Diagnosis Date Noted   SVT (supraventricular tachycardia) 08/30/2018   BPH (benign prostatic hyperplasia) 06/13/2016   Hepatitis 06/13/2016   Mixed hyperlipidemia 06/13/2016   Hypertension 06/13/2016   Palpitations 06/12/2015   Benign essential hypertension 06/11/2011   History of hepatitis 06/11/2011     Physical Exam  Triage Vital Signs: ED Triage Vitals  Enc Vitals Group     BP 09/09/22 1056 125/84     Pulse Rate 09/09/22 1056 (!) 152     Resp 09/09/22 1056 (!) 26     Temp --      Temp src --      SpO2 09/09/22 1056 94 %     Weight 09/09/22 1054 154 lb 15.7 oz (70.3 kg)     Height 09/09/22 1054 '5\' 8"'$  (1.727 m)     Head Circumference --      Peak Flow --      Pain Score 09/09/22 1053 0     Pain Loc --      Pain Edu? --      Excl. in [place]? --     Most recent vital signs: Vitals:   09/09/22 1300 09/09/22 1330  BP: 124/87 110/80  Pulse: (!) 51 (!) 55  Resp: 20 (!) 24  Temp:    SpO2: 96% 99%      General: Awake, no distress.  CV:  Good peripheral perfusion.  No peripheral edema, tachycardic, irregular rhythm Resp:  Normal effort.  No increased work of breathing Abd:  No distention.  Neuro:             Awake, Alert, Oriented x 3  Other:  Aox3, nml speech  PERRL, EOMI, face symmetric, nml tongue movement  5/5 strength in the BL upper and lower extremities  Sensation grossly intact in the BL upper and lower extremities  Finger-nose-finger intact BL    ED Results / Procedures / Treatments  Labs (all labs ordered are listed, but only abnormal results are displayed) Labs Reviewed  RESP PANEL BY RT-PCR (RSV, FLU A&B, COVID)  RVPGX2 - Abnormal; Notable for the following components:      Result Value   SARS Coronavirus 2 by RT PCR POSITIVE (*)    All other components within normal limits  BASIC METABOLIC PANEL - Abnormal; Notable for the following components:   CO2 21 (*)    Glucose, Bld 147 (*)  BUN 31 (*)    Creatinine, Ser 1.45 (*)    Calcium 8.4 (*)    GFR, Estimated 46 (*)    All other components within normal limits  CBC - Abnormal; Notable for the following components:   RBC 3.97 (*)    Hemoglobin 11.3 (*)    HCT 35.3 (*)    All other components within normal limits  HEPATIC FUNCTION PANEL - Abnormal; Notable for the following components:   Albumin 3.3 (*)    AST 75 (*)    ALT 48 (*)    Bilirubin, Direct 0.3 (*)    All other components within normal limits  T4, FREE - Abnormal; Notable for the following components:   Free T4 1.50 (*)    All other components within normal limits  TROPONIN I (HIGH SENSITIVITY) - Abnormal; Notable for the following components:   Troponin I (High Sensitivity) 1,103 (*)    All other components within normal limits  CULTURE, BLOOD (ROUTINE X 2)  CULTURE, BLOOD (ROUTINE X 2)  MAGNESIUM  TSH  URINALYSIS, ROUTINE W REFLEX MICROSCOPIC  LACTIC ACID, PLASMA  LACTIC ACID, PLASMA  PROCALCITONIN  CBG MONITORING, ED  TROPONIN I  (HIGH SENSITIVITY)     EKG  EKG reviewed interpreted myself shows atrial fibrillation with rapid rate normal axis normal intervals, use ST depression throughout   RADIOLOGY I reviewed and interpreted the CXR which does not show any acute cardiopulmonary process    PROCEDURES:  Critical Care performed: Yes, see critical care procedure note(s)  .1-3 Lead EKG Interpretation  Performed by: Rada Hay, MD Authorized by: Rada Hay, MD     Interpretation: abnormal     ECG rate assessment: tachycardic     Rhythm: atrial fibrillation     Ectopy: none     Conduction: normal   .Critical Care  Performed by: Rada Hay, MD Authorized by: Rada Hay, MD   Critical care provider statement:    Critical care time (minutes):  30   Critical care was time spent personally by me on the following activities:  Development of treatment plan with patient or surrogate, discussions with consultants, evaluation of patient's response to treatment, examination of patient, ordering and review of laboratory studies, ordering and review of radiographic studies, ordering and performing treatments and interventions, pulse oximetry, re-evaluation of patient's condition and review of old charts   The patient is on the cardiac monitor to evaluate for evidence of arrhythmia and/or significant heart rate changes.   MEDICATIONS ORDERED IN ED: Medications  diltiazem (CARDIZEM) 125 mg in dextrose 5% 125 mL (1 mg/mL) infusion (15 mg/hr Intravenous Infusion Verify 09/09/22 1425)  cefTRIAXone (ROCEPHIN) 2 g in sodium chloride 0.9 % 100 mL IVPB (has no administration in time range)  azithromycin (ZITHROMAX) 500 mg in sodium chloride 0.9 % 250 mL IVPB (has no administration in time range)  lactated ringers bolus 1,000 mL (0 mLs Intravenous Stopped 09/09/22 1225)  ondansetron (ZOFRAN) injection 4 mg (4 mg Intravenous Given 09/09/22 1119)  diltiazem (CARDIZEM) injection 10 mg (10 mg  Intravenous Given 09/09/22 1205)  lactated ringers bolus 1,000 mL (1,000 mLs Intravenous New Bag/Given 09/09/22 1225)  aspirin chewable tablet 324 mg (324 mg Oral Given 09/09/22 1308)  iohexol (OMNIPAQUE) 350 MG/ML injection 75 mL (75 mLs Intravenous Contrast Given 09/09/22 1353)     IMPRESSION / MDM / ASSESSMENT AND PLAN / ED COURSE  I reviewed the triage vital signs and the nursing notes.  Patient's presentation is most consistent with acute presentation with potential threat to life or bodily function.  Differential diagnosis includes, but is not limited to, dehydration, AKI, electrolyte abnormality, pneumonia  The patient is a 89 year old male who presents with generalized weakness decreased p.o. intake and nausea.  He had what sounds like a GI illness back around Christmas but has had persistent nausea and decreased p.o. intake since that time.  He is now tachycardic and feeling generally weak.  On arrival heart rates in the 140s blood pressure with MAP greater than 75 but on the softer side.  He is somewhat tachypneic.  Overall he appears well does look dry.  Abdomen is soft and nontender and he is not having abdominal pain there is no longer vomiting or having diarrhea.  Patient's EKG shows A-fib RVR.  I suspect that hypovolemia and dehydration is in part driving this will give a bolus of fluid and will treat rate if not significantly improving.  Will check labs electrolytes and troponin.   Patient CBC and BMP are reassuring.  Notably troponin is over thousand.  He did respond to diltiazem heart rates in the 1 teens to intermittently 120s.  Will start on a dill drip.  He is also COVID-positive.  Given the tachycardia COVID-positive and troponin will obtain a CT angio to rule out pulmonary embolism.  Will give an aspirin and plan to start on heparin.  Patient did not take his Eliquis this morning, confirm with pharmacy okay to start on heparin.  He is on Dilt drip  currently heart rates in the 120s.  CTA is negative for PE although contrast bolus was timed poorly.  He does have what looks open multifocal pneumonia which could certainly be COVID versus bacterial pneumonia.  Will add on procalcitonin and cover with ceftriaxone and Zithromax.  Will add on lactate blood cultures.  He will need admission.   FINAL CLINICAL IMPRESSION(S) / ED DIAGNOSES   Final diagnoses:  None     Rx / DC Orders   ED Discharge Orders     None        Note:  This document was prepared using Dragon voice recognition software and may include unintentional dictation errors.   Rada Hay, MD 09/09/22 [phone]

## 2022-09-09 NOTE — Assessment & Plan Note (Addendum)
-   I suspect this is secondary to demand ischemia as patient is not endorsing chest pain - We will continue to follow - Message nursing staff that second high sensitive troponin is due for collection - Continue heparin GTT  Addendum: Troponin continues to remain elevated with positive delta - Complete echo ordered - Continue to trend high sensitive troponin - Cardiology has been consulted via secure chat and epic order, to [facility] clinic on call cardiologist, Dr. Clayborn Bigness - Continue heparin GTT

## 2022-09-09 NOTE — ED Triage Notes (Signed)
BIB [facility] from home due to progressive weakness over last few days and through weekend. No chest pain, no SOB. Hx of afib 118-130, CBG 208.

## 2022-09-10 ENCOUNTER — Inpatient Hospital Stay
Admit: 2022-09-10 | Discharge: 2022-09-10 | Disposition: A | Discharge status: Home / Self Care | Payer: Medicare Other | Attending: Cardiology | Admitting: Cardiology

## 2022-09-10 DIAGNOSIS — R6520 Severe sepsis without septic shock: Secondary | ICD-10-CM | POA: Diagnosis not present

## 2022-09-10 DIAGNOSIS — A419 Sepsis, unspecified organism: Secondary | ICD-10-CM | POA: Diagnosis not present

## 2022-09-10 LAB — CBC
HCT: 29.5 % — ABNORMAL LOW (ref 39.0–52.0)
Hemoglobin: 9.7 g/dL — ABNORMAL LOW (ref 13.0–17.0)
MCH: 29.1 pg (ref 26.0–34.0)
MCHC: 32.9 g/dL (ref 30.0–36.0)
MCV: 88.6 fL (ref 80.0–100.0)
Platelets: 193 10*3/uL (ref 150–400)
RBC: 3.33 MIL/uL — ABNORMAL LOW (ref 4.22–5.81)
RDW: 13.9 % (ref 11.5–15.5)
WBC: 3.6 10*3/uL — ABNORMAL LOW (ref 4.0–10.5)
nRBC: 0 % (ref 0.0–0.2)

## 2022-09-10 LAB — COMPREHENSIVE METABOLIC PANEL
ALT: 45 U/L — ABNORMAL HIGH (ref 0–44)
AST: 65 U/L — ABNORMAL HIGH (ref 15–41)
Albumin: 3 g/dL — ABNORMAL LOW (ref 3.5–5.0)
Alkaline Phosphatase: 73 U/L (ref 38–126)
Anion gap: 8 (ref 5–15)
BUN: 26 mg/dL — ABNORMAL HIGH (ref 8–23)
CO2: 20 mmol/L — ABNORMAL LOW (ref 22–32)
Calcium: 7.8 mg/dL — ABNORMAL LOW (ref 8.9–10.3)
Chloride: 109 mmol/L (ref 98–111)
Creatinine, Ser: 1.22 mg/dL (ref 0.61–1.24)
GFR, Estimated: 56 mL/min — ABNORMAL LOW (ref >60)
Glucose, Bld: 116 mg/dL — ABNORMAL HIGH (ref 70–99)
Potassium: 3.6 mmol/L (ref 3.5–5.1)
Sodium: 137 mmol/L (ref 135–145)
Total Bilirubin: 0.7 mg/dL (ref 0.3–1.2)
Total Protein: 5.9 g/dL — ABNORMAL LOW (ref 6.5–8.1)

## 2022-09-10 LAB — APTT
aPTT: 144 seconds — ABNORMAL HIGH (ref 24–36)
aPTT: 89 seconds — ABNORMAL HIGH (ref 24–36)

## 2022-09-10 LAB — HEPARIN LEVEL (UNFRACTIONATED): Heparin Unfractionated: >1.1 IU/mL — ABNORMAL HIGH (ref 0.30–0.70)

## 2022-09-10 LAB — URINE CULTURE

## 2022-09-10 LAB — TROPONIN I (HIGH SENSITIVITY)
Troponin I (High Sensitivity): 3305 ng/L (ref <18)
Troponin I (High Sensitivity): 7039 ng/L (ref <18)

## 2022-09-10 MED ORDER — DILTIAZEM HCL 60 MG PO TABS
60.0000 mg | ORAL_TABLET | Freq: Four times a day (QID) | ORAL | Status: DC
  Administered 2022-09-10 – 2022-09-11 (×5): 60 mg via ORAL
  Filled 2022-09-10 (×5): qty 1

## 2022-09-10 MED ORDER — HYDRALAZINE HCL 20 MG/ML IJ SOLN: 10.0000 mg | INTRAMUSCULAR | Status: DC | PRN

## 2022-09-10 MED ORDER — METOPROLOL TARTRATE 5 MG/5ML IV SOLN: 5.0000 mg | INTRAVENOUS | Status: DC | PRN

## 2022-09-10 MED ORDER — APIXABAN 5 MG PO TABS
5.0000 mg | ORAL_TABLET | Freq: Two times a day (BID) | ORAL | Status: DC
  Administered 2022-09-10 – 2022-09-11 (×3): 5 mg via ORAL
  Filled 2022-09-10 (×3): qty 1

## 2022-09-10 MED ORDER — TRAZODONE HCL 50 MG PO TABS: 50.0000 mg | ORAL_TABLET | Freq: Every evening | ORAL | Status: DC | PRN

## 2022-09-10 MED ORDER — GUAIFENESIN 100 MG/5ML PO LIQD
5.0000 mL | ORAL | Status: DC | PRN
  Administered 2022-09-11: 5 mL via ORAL
  Filled 2022-09-10: qty 10

## 2022-09-10 MED ORDER — POTASSIUM CHLORIDE 20 MEQ PO PACK
40.0000 meq | PACK | Freq: Once | ORAL | Status: AC
  Administered 2022-09-10: 40 meq via ORAL
  Filled 2022-09-10: qty 2

## 2022-09-10 MED ORDER — OXYCODONE HCL 5 MG PO TABS: 5.0000 mg | ORAL_TABLET | ORAL | Status: DC | PRN

## 2022-09-10 MED ORDER — ASPIRIN 81 MG PO CHEW
81.0000 mg | CHEWABLE_TABLET | Freq: Every day | ORAL | Status: DC
  Administered 2022-09-10 – 2022-09-11 (×2): 81 mg via ORAL
  Filled 2022-09-10 (×2): qty 1

## 2022-09-10 MED ORDER — SENNOSIDES-DOCUSATE SODIUM 8.6-50 MG PO TABS: 1.0000 | ORAL_TABLET | Freq: Every evening | ORAL | Status: DC | PRN

## 2022-09-10 MED ORDER — IPRATROPIUM-ALBUTEROL 0.5-2.5 (3) MG/3ML IN SOLN
3.0000 mL | Freq: Four times a day (QID) | RESPIRATORY_TRACT | Status: DC
  Administered 2022-09-10 – 2022-09-11 (×4): 3 mL via RESPIRATORY_TRACT
  Filled 2022-09-10 (×4): qty 3

## 2022-09-10 NOTE — Consult Note (Signed)
CARDIOLOGY CONSULT NOTE               Patient ID: Evan Ramirez MRN: [id number] DOB/AGE: 03-Aug-1930 89 y.o.  Admit date: 09/09/2022 Referring Physician Judd Gaudier, MD hospitalist Primary Physician Johny Drilling MD primary Primary Cardiologist Nehemiah Massed Reason for Consultation atrial fibrillation rapid ventricular response elevated troponin COVID-14    HPI: 89 year old white male multimedical problems atrial fibrillation chronic hypertension hyperlipidemia presented with COVID UTI and sepsis patient was treated with antibiotic therapy troponins increased from 1000 7000 patient denies any chest pain.  EKG was consistent rapid atrial fibrillation with diffuse ST segment changes.  Patient was treated for blood pressure control patient anticoagulation from Eliquis to heparin and antibiotic therapy and respiratory support for COVID  Review of systems complete and found to be negative unless listed above     Past Medical History:  Diagnosis Date   Atrial fibrillation (HCC)    BPH (benign prostatic hyperplasia)    HLD (hyperlipidemia)    Hypertension    Mitral valve disease    Pneumonia     Past Surgical History:  Procedure Laterality Date   CATARACT EXTRACTION     ear drum surgery     INGUINAL HERNIA REPAIR Right 07/07/2020   Procedure: HERNIA REPAIR INGUINAL ADULT;  Surgeon: Benjamine Sprague, DO;  Location: [facility] ORS;  Service: General;  Laterality: Right;   PROSTATE BIOPSY N/A 09/14/2020   Procedure: PROSTATE BIOPSY URONAV PROSTATE;  Surgeon: Royston Cowper, MD;  Location: [facility] ORS;  Service: Urology;  Laterality: N/A;   TONSILLECTOMY AND ADENOIDECTOMY      (Not in a hospital admission)  Social History   Socioeconomic History   Marital status: Married    Spouse name: Not on file   Number of children: Not on file   Years of education: Not on file   Highest education level: Not on file  Occupational History   Not on file  Tobacco Use   Smoking status: Every Day     Packs/day: 1.00    Years: 60.00    Total pack years: 60.00    Types: Cigarettes   Smokeless tobacco: Never  Vaping Use   Vaping Use: Never used  Substance and Sexual Activity   Alcohol use: No   Drug use: No   Sexual activity: Not Currently    Birth control/protection: None  Other Topics Concern   Not on file  Social History Narrative   Not on file   Social Determinants of Health   Financial Resource Strain: Not on file  Food Insecurity: Not on file  Transportation Needs: Not on file  Physical Activity: Not on file  Stress: Not on file  Social Connections: Not on file  Intimate Partner Violence: Not on file    Family History  Problem Relation Age of Onset   Parkinson's disease Mother    CVA Father    CAD Father       Review of systems complete and found to be negative unless listed above      PHYSICAL EXAM  General: Well developed, well nourished, in no acute distress HEENT:  Normocephalic and atramatic Neck:  No JVD.  Lungs: Clear bilaterally to auscultation and percussion. Heart: Irregular irregular. Normal S1 and S2 without gallops or murmurs.  Abdomen: Bowel sounds are positive, abdomen soft and non-tender  Msk:  Back normal, normal gait. Normal strength and tone for age. Extremities: No clubbing, cyanosis or edema.   Neuro: Alert and oriented X 3. Psych:  Good affect, responds appropriately  Labs:   Lab Results  Component Value Date   WBC 3.6 (L) 09/10/2022   HGB 9.7 (L) 09/10/2022   HCT 29.5 (L) 09/10/2022   MCV 88.6 09/10/2022   PLT 193 09/10/2022    Recent Labs  Lab 09/10/22 0601  NA 137  K 3.6  CL 109  CO2 20*  BUN 26*  CREATININE 1.22  CALCIUM 7.8*  PROT 5.9*  BILITOT 0.7  ALKPHOS 73  ALT 45*  AST 65*  GLUCOSE 116*   Lab Results  Component Value Date   TROPONINI 0.05 ([place]) 08/30/2018   No results found for: "CHOL" No results found for: "HDL" No results found for: "LDLCALC" No results found for: "TRIG" No results found  for: "CHOLHDL" No results found for: "LDLDIRECT"    Radiology: CT Angio Chest Pulmonary Embolism (PE) W or WO Contrast  Result Date: 09/09/2022 CLINICAL DATA:  Progressive weakness. EXAM: CT ANGIOGRAPHY CHEST WITH CONTRAST TECHNIQUE: Multidetector CT imaging of the chest was performed using the standard protocol during bolus administration of intravenous contrast. Multiplanar CT image reconstructions and MIPs were obtained to evaluate the vascular anatomy. RADIATION DOSE REDUCTION: This exam was performed according to the departmental dose-optimization program which includes automated exposure control, adjustment of the mA and/or kV according to patient size and/or use of iterative reconstruction technique. CONTRAST:  23m OMNIPAQUE IOHEXOL 350 MG/ML SOLN COMPARISON:  None Available. FINDINGS: Cardiovascular: There is no evidence of large central pulmonary embolus seen in the main pulmonary artery or the main portions of the left and right pulmonary arteries as well as in the upper lobe branches. However, there is very limited contrast opacification of the lower lobe branches most likely due to timing of contrast bolus as well as some degree of respiratory motion artifact. Therefore, smaller and more peripheral pulmonary emboli in the lower lobe branches of the pulmonary arteries cannot be excluded. No pericardial effusion. Coronary artery calcifications are noted. Atherosclerosis of thoracic aorta is noted without aneurysm formation. Mediastinum/Nodes: No enlarged mediastinal, hilar, or axillary lymph nodes. Thyroid gland, trachea, and esophagus demonstrate no significant findings. Lungs/Pleura: No pneumothorax or pleural effusion is noted. Patchy airspace opacities are noted in the right upper and lower lobes as well as in left basilar region, concerning for multifocal pneumonia. Bronchial wall thickening is noted in the lower lobes suggesting some degree of bronchitis. Upper Abdomen: No acute abnormality.  Musculoskeletal: No chest wall abnormality. No acute or significant osseous findings. Review of the MIP images confirms the above findings. IMPRESSION: There is no definite evidence of large central pulmonary embolus seen in the main pulmonary artery or the main portions of the left and right pulmonary arteries. However, evaluation of the lower lobe branches of both pulmonary arteries is extremely limited due to limited contrast opacification most likely due to timing of contrast bolus as well as some degree of respiratory motion artifact. Therefore, smaller and more peripheral pulmonary emboli cannot be excluded on the basis of this exam. Patchy airspace opacities are noted in the right upper and lower lobes as well as in the left basilar region, concerning for multifocal pneumonia. Bronchial wall thickening is noted in both lower lobes suggesting some degree of bronchitis. Coronary artery calcifications are noted suggesting coronary artery disease. Aortic Atherosclerosis (ICD10-I70.0). Electronically Signed   By: JMarijo ConceptionM.D.   On: 09/09/2022 14:19   DG Chest 2 View  Result Date: 09/09/2022 CLINICAL DATA:  Shortness of breath. Weakness and dry cough for  a few days. EXAM: CHEST - 2 VIEW COMPARISON:  Chest two views 08/30/2018 FINDINGS: Cardiac silhouette and mediastinal contours are within limits. Moderate calcification within the aortic arch. Resolution of prior mild interstitial thickening. Overall improved aeration without definite acute airspace opacity seen. No pleural effusion pneumothorax. Mild-to-moderate multilevel degenerative disc changes of the thoracic spine. IMPRESSION: Resolution of prior mild interstitial thickening. Overall improved aeration without definite acute airspace opacity seen. Electronically Signed   By: Yvonne Kendall M.D.   On: 09/09/2022 11:48    EKG: Initial EKG rapid atrial fibrillation diffuse ST segment changes rate of 150  EKG today atrial fibrillation rate  controlled at 75 resolution of ST segment changes  ASSESSMENT AND PLAN:  Atrial fibrillation RVR Elevated troponin Hypertension  Hyperlipidemia Sepsis COVID infection UTI . Plan Agree with admit for management of sepsis and COVID Supplemental oxygen as necessary Agree with rate control for atrial fibrillation metoprolol Cardizem but will transition from IV to p.o. Cardizem Anticoagulation will transition from heparin back to Eliquis Continue hypertensive control metoprolol Cardizem for now Antibiotic therapy for urinary tract as well as potential lung infection Doubt acute MI this looks more like demand ischemia do not recommend any invasive procedures at this stage  Signed: Yolonda Kida MD  09/10/2022, 12:55 PM

## 2022-09-10 NOTE — Progress Notes (Signed)
*  PRELIMINARY RESULTS* Echocardiogram 2D Echocardiogram has been performed.  Evan Ramirez 09/10/2022, 3:16 PM

## 2022-09-10 NOTE — ED Notes (Signed)
Pt took his own dose of Relugolix

## 2022-09-10 NOTE — ED Notes (Signed)
Helped patient self cath.

## 2022-09-10 NOTE — Consult Note (Signed)
ANTICOAGULATION CONSULT NOTE  Pharmacy Consult for Heparin Indication: chest pain/ACS  No Known Allergies  Patient Measurements: Height: '5\' 8"'$  (172.7 cm) Weight: 70.3 kg (154 lb 15.7 oz) IBW/kg (Calculated) : 68.4 Heparin Dosing Weight: 70.3 kg  Vital Signs: Temp: 98 F (36.7 C) (01/09 0000) Temp Source: Oral (01/08 2300) BP: 111/66 (01/09 0000) Pulse Rate: 65 (01/09 0000)  Labs: Recent Labs    09/09/22 1055 09/09/22 1518 09/09/22 1827 09/09/22 1929 09/09/22 2348  HGB 11.3*  --   --   --   --   HCT 35.3*  --   --   --   --   PLT 226  --   --   --   --   APTT  --  40*  --   --  144*  LABPROT  --  15.5*  --   --   --   INR  --  1.2  --   --   --   HEPARINUNFRC  --  >1.10*  --   --   --   CREATININE 1.45*  --   --   --   --   TROPONINIHS 1,103* 2,961* 5,298* 6,580* 7,039*     Estimated Creatinine Clearance: 32.8 mL/min (A) (by C-G formula based on SCr of 1.45 mg/dL (H)).   Medical History: Past Medical History:  Diagnosis Date   Atrial fibrillation (HCC)    BPH (benign prostatic hyperplasia)    HLD (hyperlipidemia)    Hypertension    Mitral valve disease    Pneumonia     Medications:  PTA: Apixaban '5mg'$  BID. Last dose: 09/08/22@~2200  Assessment: 89 yo male with PMH of HLD, Atrial fibrillation(on Eliquis PTA) and CAD presented to the ED with chief concerns of weakness, nausea, vomiting, and diarrhea. While in the ED, patient had increased heart rate, elevated troponin levels, and was found to be in atrial fibrillation with RVR. Patient's last Apixaban dose was on 09/08/22@~2200. Pharmacy team was consulted to monitor and titrate heparin infusion while admitted.  Baseline labs: HL>1.10, aPTT 40sec, Hgb 11.3, Plts 226  Goal of Therapy:  Heparin level 0.3-0.7 units/ml aPTT: 66-102 seconds Monitor platelets by anticoagulation protocol: Yes  2348 2348 aPTT 144, supratherapeutic   Plan:  Decrease heparin infusion to 700 units/hr Recheck aPTT level & HL w/ AM  labs. Will transition to HL dosing once both levels correlate. Continue to monitor H&H and platelets  Renda Rolls, PharmD, [facility] 09/10/2022 12:36 AM

## 2022-09-10 NOTE — Progress Notes (Signed)
PROGRESS NOTE    Evan Ramirez  UMP:[id number] DOB: [birth year] DOA: 09/09/2022 PCP: Valera Castle, MD   Brief Narrative:  89 year old with history of hypertension, hyperlipidemia, chronic A-fib comes to the hospital with complaints of shortness of breath and congestion.  Patient was found to have COVID-19 infection, atrial fibrillation with RVR and significantly elevated troponin level.  Cardiology team was consulted.   Assessment & Plan:  Principal Problem:   Severe sepsis with acute organ dysfunction (HCC) Active Problems:   BPH (benign prostatic hyperplasia)   Mixed hyperlipidemia   Hypertension   Atrial fibrillation, chronic (HCC)   Elevated troponin   Chronic atrial fibrillation with RVR ([place])   COVID-19 virus detected     Assessment and Plan: * Severe sepsis with acute organ dysfunction ([place]) COVID-19 infection Urinary tract infection -Sepsis physiology is improving.  Follow-up culture data.  ProCal  negative.  Follow-up urine culture.  Continue empiric IV Rocephin, will discontinue azithromycin. - Supportive care, I-S/flutter valve, bronchodilators  Elevated troponin/NSTEMI - Troponins have significantly trended up. 1K > 7K. [facility] cardiology team consulted.  Awaiting their input.  Currently on heparin drip. - Echocardiogram-pending  Atrial fibrillation with RVR (HCC) - On home metoprolol, amiodarone.  Cardizem drip currently.  Continue Eliquis at home but currently on heparin drip  Hypertension - On multiple medications as mentioned above.  IV as needed  DVT prophylaxis: On heparin drip Code Status: Full code Family Communication: Son present at bedside  Status is: Inpatient Continue treatment for UTI, COVID-19 infection and NSTEMI     Subjective: Seen and examined at bedside.  Patient states his breathing is significantly improved and overall does not have any symptoms at this time and he would like to go home.  I explained to him that he still  needs to remain in the hospital given his significantly elevated enzymes, still in atrial fibrillation with RVR on 15 of Cardizem.   Examination:  General exam: Appears calm and comfortable  Respiratory system: Minimal bibasilar rhonchi Cardiovascular system: Irregularly irregular rhythm.  Tachycardia. Gastrointestinal system: Abdomen is nondistended, soft and nontender. No organomegaly or masses felt. Normal bowel sounds heard. Central nervous system: Alert and oriented. No focal neurological deficits. Extremities: Symmetric 5 x 5 power. Skin: No rashes, lesions or ulcers Psychiatry: Judgement and insight appear normal. Mood & affect appropriate.     Objective: Vitals:   09/10/22 0500 09/10/22 0630 09/10/22 0700 09/10/22 0730  BP: 119/67 118/66 117/74 (!) 105/57  Pulse: 64 67 70 68  Resp: 17 20 (!) 22 20  Temp: 97.8 F (36.6 C)     TempSrc: Oral     SpO2: 95% 96% 95% 93%  Weight:      Height:        Intake/Output Summary (Last 24 hours) at 09/10/2022 0858 Last data filed at 09/10/2022 0500 Gross per 24 hour  Intake 1153.07 ml  Output 600 ml  Net 553.07 ml   Filed Weights   09/09/22 1054  Weight: 70.3 kg     Data Reviewed:   CBC: Recent Labs  Lab 09/09/22 1055 09/10/22 0601  WBC 5.3 3.6*  HGB 11.3* 9.7*  HCT 35.3* 29.5*  MCV 88.9 88.6  PLT 226 400   Basic Metabolic Panel: Recent Labs  Lab 09/09/22 1055 09/09/22 1518 09/10/22 0601  NA 138  --  137  K 4.1  --  3.6  CL 108  --  109  CO2 21*  --  20*  GLUCOSE 147*  --  116*  BUN 31*  --  26*  CREATININE 1.45*  --  1.22  CALCIUM 8.4*  --  7.8*  MG 2.1 2.0  --   PHOS  --  2.9  --    GFR: Estimated Creatinine Clearance: 38.9 mL/min (by C-G formula based on SCr of 1.22 mg/dL). Liver Function Tests: Recent Labs  Lab 09/09/22 1055 09/10/22 0601  AST 75* 65*  ALT 48* 45*  ALKPHOS 80 73  BILITOT 0.9 0.7  PROT 6.7 5.9*  ALBUMIN 3.3* 3.0*   No results for input(s): "LIPASE", "AMYLASE" in the last  168 hours. No results for input(s): "AMMONIA" in the last 168 hours. Coagulation Profile: Recent Labs  Lab 09/09/22 1518  INR 1.2   Cardiac Enzymes: No results for input(s): "CKTOTAL", "CKMB", "CKMBINDEX", "TROPONINI" in the last 168 hours. BNP (last 3 results) No results for input(s): "PROBNP" in the last 8760 hours. HbA1C: No results for input(s): "HGBA1C" in the last 72 hours. CBG: No results for input(s): "GLUCAP" in the last 168 hours. Lipid Profile: No results for input(s): "CHOL", "HDL", "LDLCALC", "TRIG", "CHOLHDL", "LDLDIRECT" in the last 72 hours. Thyroid Function Tests: Recent Labs    09/09/22 1055  TSH 2.182  FREET4 1.50*   Anemia Panel: No results for input(s): "VITAMINB12", "FOLATE", "FERRITIN", "TIBC", "IRON", "RETICCTPCT" in the last 72 hours. Sepsis Labs: Recent Labs  Lab 09/09/22 1518 09/09/22 1827  PROCALCITON <0.10  --   LATICACIDVEN 1.3 1.3    Recent Results (from the past 240 hour(s))  Resp panel by RT-PCR (RSV, Flu A&B, Covid) Anterior Nasal Swab     Status: Abnormal   Collection Time: 09/09/22 10:55 AM   Specimen: Anterior Nasal Swab  Result Value Ref Range Status   SARS Coronavirus 2 by RT PCR POSITIVE (A) NEGATIVE Final    Comment: (NOTE) SARS-CoV-2 target nucleic acids are DETECTED.  The SARS-CoV-2 RNA is generally detectable in upper respiratory specimens during the acute phase of infection. Positive results are indicative of the presence of the identified virus, but do not rule out bacterial infection or co-infection with other pathogens not detected by the test. Clinical correlation with patient history and other diagnostic information is necessary to determine patient infection status. The expected result is Negative.  Fact Sheet for Patients: EntrepreneurPulse.com.au  Fact Sheet for Healthcare Providers: IncredibleEmployment.be  This test is not yet approved or cleared by the [place]  FDA and  has been authorized for detection and/or diagnosis of SARS-CoV-2 by FDA under an Emergency Use Authorization (EUA).  This EUA will remain in effect (meaning this test can be used) for the duration of  the COVID-19 declaration under Section 564(b)(1) of the A ct, 21 U.S.C. section 360bbb-3(b)(1), unless the authorization is terminated or revoked sooner.     Influenza A by PCR NEGATIVE NEGATIVE Final   Influenza B by PCR NEGATIVE NEGATIVE Final    Comment: (NOTE) The Xpert Xpress SARS-CoV-2/FLU/RSV plus assay is intended as an aid in the diagnosis of influenza from Nasopharyngeal swab specimens and should not be used as a sole basis for treatment. Nasal washings and aspirates are unacceptable for Xpert Xpress SARS-CoV-2/FLU/RSV testing.  Fact Sheet for Patients: EntrepreneurPulse.com.au  Fact Sheet for Healthcare Providers: IncredibleEmployment.be  This test is not yet approved or cleared by the [place] FDA and has been authorized for detection and/or diagnosis of SARS-CoV-2 by FDA under an Emergency Use Authorization (EUA). This EUA will remain in effect (meaning this test can be used) for the duration  of the COVID-19 declaration under Section 564(b)(1) of the Act, 21 U.S.C. section 360bbb-3(b)(1), unless the authorization is terminated or revoked.     Resp Syncytial Virus by PCR NEGATIVE NEGATIVE Final    Comment: (NOTE) Fact Sheet for Patients: EntrepreneurPulse.com.au  Fact Sheet for Healthcare Providers: IncredibleEmployment.be  This test is not yet approved or cleared by the [place] FDA and has been authorized for detection and/or diagnosis of SARS-CoV-2 by FDA under an Emergency Use Authorization (EUA). This EUA will remain in effect (meaning this test can be used) for the duration of the COVID-19 declaration under Section 564(b)(1) of the Act, 21 U.S.C. section  360bbb-3(b)(1), unless the authorization is terminated or revoked.  Performed at [facility], [address] [place], [place] [address]   Blood culture (routine x 2)     Status: None (Preliminary result)   Collection Time: 09/09/22  3:18 PM   Specimen: BLOOD  Result Value Ref Range Status   Specimen Description BLOOD BLOOD LEFT ARM  Final   Special Requests   Final    BOTTLES DRAWN AEROBIC AND ANAEROBIC Blood Culture adequate volume   Culture   Final    NO GROWTH < 24 HOURS Performed at [facility], [address], [place], [place] [address]    Report Status PENDING  Incomplete  Blood culture (routine x 2)     Status: None (Preliminary result)   Collection Time: 09/09/22  3:18 PM   Specimen: BLOOD  Result Value Ref Range Status   Specimen Description BLOOD BLOOD RIGHT ARM  Final   Special Requests   Final    BOTTLES DRAWN AEROBIC AND ANAEROBIC Blood Culture adequate volume   Culture   Final    NO GROWTH < 24 HOURS Performed at [facility], [address], [place], [place] [address]    Report Status PENDING  Incomplete  MRSA Next Gen by PCR, Nasal     Status: None   Collection Time: 09/09/22  6:34 PM   Specimen: Nasal Mucosa; Nasal Swab  Result Value Ref Range Status   MRSA by PCR Next Gen NOT DETECTED NOT DETECTED Final    Comment: (NOTE) The GeneXpert MRSA Assay (FDA approved for NASAL specimens only), is one component of a comprehensive MRSA colonization surveillance program. It is not intended to diagnose MRSA infection nor to guide or monitor treatment for MRSA infections. Test performance is not FDA approved in patients less than 70 years old. Performed at [facility], [address] [place], [place] [address]   Respiratory (~20 pathogens) panel by PCR     Status: None   Collection Time: 09/09/22  6:34 PM   Specimen: Nasal Mucosa; Respiratory  Result Value Ref Range Status   Adenovirus NOT DETECTED NOT DETECTED Final    Coronavirus 229E NOT DETECTED NOT DETECTED Final    Comment: (NOTE) The Coronavirus on the Respiratory Panel, DOES NOT test for the novel  Coronavirus (2019 nCoV)    Coronavirus HKU1 NOT DETECTED NOT DETECTED Final   Coronavirus NL63 NOT DETECTED NOT DETECTED Final   Coronavirus OC43 NOT DETECTED NOT DETECTED Final   Metapneumovirus NOT DETECTED NOT DETECTED Final   Rhinovirus / Enterovirus NOT DETECTED NOT DETECTED Final   Influenza A NOT DETECTED NOT DETECTED Final   Influenza B NOT DETECTED NOT DETECTED Final   Parainfluenza Virus 1 NOT DETECTED NOT DETECTED Final   Parainfluenza Virus 2 NOT DETECTED NOT DETECTED Final   Parainfluenza Virus 3 NOT DETECTED  NOT DETECTED Final   Parainfluenza Virus 4 NOT DETECTED NOT DETECTED Final   Respiratory Syncytial Virus NOT DETECTED NOT DETECTED Final   Bordetella pertussis NOT DETECTED NOT DETECTED Final   Bordetella Parapertussis NOT DETECTED NOT DETECTED Final   Chlamydophila pneumoniae NOT DETECTED NOT DETECTED Final   Mycoplasma pneumoniae NOT DETECTED NOT DETECTED Final    Comment: Performed at [facility], [place] [address], [place], [place] [address]         Radiology Studies: CT Angio Chest Pulmonary Embolism (PE) W or WO Contrast  Result Date: 09/09/2022 CLINICAL DATA:  Progressive weakness. EXAM: CT ANGIOGRAPHY CHEST WITH CONTRAST TECHNIQUE: Multidetector CT imaging of the chest was performed using the standard protocol during bolus administration of intravenous contrast. Multiplanar CT image reconstructions and MIPs were obtained to evaluate the vascular anatomy. RADIATION DOSE REDUCTION: This exam was performed according to the departmental dose-optimization program which includes automated exposure control, adjustment of the mA and/or kV according to patient size and/or use of iterative reconstruction technique. CONTRAST:  71m OMNIPAQUE IOHEXOL 350 MG/ML SOLN COMPARISON:  None Available. FINDINGS: Cardiovascular: There is  no evidence of large central pulmonary embolus seen in the main pulmonary artery or the main portions of the left and right pulmonary arteries as well as in the upper lobe branches. However, there is very limited contrast opacification of the lower lobe branches most likely due to timing of contrast bolus as well as some degree of respiratory motion artifact. Therefore, smaller and more peripheral pulmonary emboli in the lower lobe branches of the pulmonary arteries cannot be excluded. No pericardial effusion. Coronary artery calcifications are noted. Atherosclerosis of thoracic aorta is noted without aneurysm formation. Mediastinum/Nodes: No enlarged mediastinal, hilar, or axillary lymph nodes. Thyroid gland, trachea, and esophagus demonstrate no significant findings. Lungs/Pleura: No pneumothorax or pleural effusion is noted. Patchy airspace opacities are noted in the right upper and lower lobes as well as in left basilar region, concerning for multifocal pneumonia. Bronchial wall thickening is noted in the lower lobes suggesting some degree of bronchitis. Upper Abdomen: No acute abnormality. Musculoskeletal: No chest wall abnormality. No acute or significant osseous findings. Review of the MIP images confirms the above findings. IMPRESSION: There is no definite evidence of large central pulmonary embolus seen in the main pulmonary artery or the main portions of the left and right pulmonary arteries. However, evaluation of the lower lobe branches of both pulmonary arteries is extremely limited due to limited contrast opacification most likely due to timing of contrast bolus as well as some degree of respiratory motion artifact. Therefore, smaller and more peripheral pulmonary emboli cannot be excluded on the basis of this exam. Patchy airspace opacities are noted in the right upper and lower lobes as well as in the left basilar region, concerning for multifocal pneumonia. Bronchial wall thickening is noted in both  lower lobes suggesting some degree of bronchitis. Coronary artery calcifications are noted suggesting coronary artery disease. Aortic Atherosclerosis (ICD10-I70.0). Electronically Signed   By: JMarijo ConceptionM.D.   On: 09/09/2022 14:19   DG Chest 2 View  Result Date: 09/09/2022 CLINICAL DATA:  Shortness of breath. Weakness and dry cough for a few days. EXAM: CHEST - 2 VIEW COMPARISON:  Chest two views 08/30/2018 FINDINGS: Cardiac silhouette and mediastinal contours are within limits. Moderate calcification within the aortic arch. Resolution of prior mild interstitial thickening. Overall improved aeration without definite acute airspace opacity seen. No pleural effusion pneumothorax. Mild-to-moderate multilevel degenerative disc changes of  the thoracic spine. IMPRESSION: Resolution of prior mild interstitial thickening. Overall improved aeration without definite acute airspace opacity seen. Electronically Signed   By: Yvonne Kendall M.D.   On: 09/09/2022 11:48        Scheduled Meds:  amiodarone  200 mg Oral Daily   aspirin  81 mg Oral Daily   metoprolol succinate  50 mg Oral Daily   Relugolix  120 mg Oral QAC breakfast   Continuous Infusions:  azithromycin     cefTRIAXone (ROCEPHIN)  IV     diltiazem (CARDIZEM) infusion 15 mg/hr (09/10/22 0332)   heparin 700 Units/hr (09/10/22 0034)     LOS: 1 day   Time spent= 35 mins    Quade Ramirez Arsenio Loader, MD [facility]  If 7PM-7AM, please contact night-coverage  09/10/2022, 8:58 AM

## 2022-09-10 NOTE — ED Notes (Addendum)
Pt assisted to restroom x1 person assist. Pt placed on a hospital bed when returned to bed- placed back on card monitor. Pt did state feeling dizzy while standing. Bland Span, Rn notified. No other needs voiced at this time. Family remains at bedside.

## 2022-09-10 NOTE — Consult Note (Signed)
ANTICOAGULATION CONSULT NOTE  Pharmacy Consult for Heparin Indication: chest pain/ACS  No Known Allergies  Patient Measurements: Height: '5\' 8"'$  (172.7 cm) Weight: 70.3 kg (154 lb 15.7 oz) IBW/kg (Calculated) : 68.4 Heparin Dosing Weight: 70.3 kg  Vital Signs: Temp: 97.8 F (36.6 C) (01/09 0500) Temp Source: Oral (01/09 0500) BP: 119/67 (01/09 0500) Pulse Rate: 64 (01/09 0500)  Labs: Recent Labs    09/09/22 1055 09/09/22 1518 09/09/22 1827 09/09/22 1929 09/09/22 2348 09/10/22 0601  HGB 11.3*  --   --   --   --  9.7*  HCT 35.3*  --   --   --   --  29.5*  PLT 226  --   --   --   --  193  APTT  --  40*  --   --  144* 89*  LABPROT  --  15.5*  --   --   --   --   INR  --  1.2  --   --   --   --   HEPARINUNFRC  --  >1.10*  --   --   --  >1.10*  CREATININE 1.45*  --   --   --   --  1.22  TROPONINIHS 1,103* 2,961* 5,298* 6,580* 7,039*  --      Estimated Creatinine Clearance: 38.9 mL/min (by C-G formula based on SCr of 1.22 mg/dL).   Medical History: Past Medical History:  Diagnosis Date   Atrial fibrillation (HCC)    BPH (benign prostatic hyperplasia)    HLD (hyperlipidemia)    Hypertension    Mitral valve disease    Pneumonia     Medications:  PTA: Apixaban '5mg'$  BID. Last dose: 09/08/22@~2200  Assessment: 89 yo male with PMH of HLD, Atrial fibrillation(on Eliquis PTA) and CAD presented to the ED with chief concerns of weakness, nausea, vomiting, and diarrhea. While in the ED, patient had increased heart rate, elevated troponin levels, and was found to be in atrial fibrillation with RVR. Patient's last Apixaban dose was on 09/08/22@~2200. Pharmacy team was consulted to monitor and titrate heparin infusion while admitted.  Baseline labs: HL>1.10, aPTT 40sec, Hgb 11.3, Plts 226  Goal of Therapy:  Heparin level 0.3-0.7 units/ml aPTT: 66-102 seconds Monitor platelets by anticoagulation protocol: Yes  01/08 2348 aPTT 144, supratherapeutic 01/09 0601 aPTT  89,therapeutic x 1, HL > 1.1   Plan:  Continue heparin infusion at 700 units/hr Recheck aPTT in 8 hr to confirm. Will transition to HL dosing once both levels correlate. Continue to monitor H&H and platelets  Renda Rolls, PharmD, [facility] 09/10/2022 6:50 AM

## 2022-09-11 DIAGNOSIS — I4891 Unspecified atrial fibrillation: Secondary | ICD-10-CM

## 2022-09-11 LAB — BASIC METABOLIC PANEL
Anion gap: 8 (ref 5–15)
BUN: 23 mg/dL (ref 8–23)
CO2: 20 mmol/L — ABNORMAL LOW (ref 22–32)
Calcium: 8.2 mg/dL — ABNORMAL LOW (ref 8.9–10.3)
Chloride: 110 mmol/L (ref 98–111)
Creatinine, Ser: 1.17 mg/dL (ref 0.61–1.24)
GFR, Estimated: 59 mL/min — ABNORMAL LOW (ref >60)
Glucose, Bld: 116 mg/dL — ABNORMAL HIGH (ref 70–99)
Potassium: 4.2 mmol/L (ref 3.5–5.1)
Sodium: 138 mmol/L (ref 135–145)

## 2022-09-11 LAB — ECHOCARDIOGRAM COMPLETE
AR max vel: 2.16 cm2
AV Area VTI: 2.24 cm2
AV Area mean vel: 2.25 cm2
AV Mean grad: 2 mmHg
AV Peak grad: 4.1 mmHg
Ao pk vel: 1.01 m/s
Area-P 1/2: 3.77 cm2
Height: 68 in
MV M vel: 3.74 m/s
MV Peak grad: 56 mmHg
MV VTI: 1.53 cm2
Radius: 0.6 cm
S' Lateral: 3.1 cm
Weight: 2479.73 oz

## 2022-09-11 LAB — MAGNESIUM: Magnesium: 2.1 mg/dL (ref 1.7–2.4)

## 2022-09-11 MED ORDER — DILTIAZEM HCL ER COATED BEADS 180 MG PO CP24: 180.0000 mg | ORAL_CAPSULE | Freq: Every day | ORAL | 1 refills | Status: DC

## 2022-09-11 MED ORDER — DILTIAZEM HCL ER COATED BEADS 180 MG PO CP24: 180.0000 mg | ORAL_CAPSULE | Freq: Every day | ORAL | Status: DC

## 2022-09-11 MED ORDER — ACETAMINOPHEN 325 MG PO TABS: 650.0000 mg | ORAL_TABLET | Freq: Four times a day (QID) | ORAL | Status: DC | PRN

## 2022-09-11 NOTE — Discharge Summary (Signed)
Physician Discharge Summary  Evan Ramirez RUE:[id number] DOB: 17-Nov-1929  PCP: Valera Castle, MD  Admitted from: Home Discharged to: Home  Admit date: 09/09/2022 Discharge date: 09/11/2022  Recommendations for Outpatient Follow-up:    Follow-up Information     Yolonda Kida, MD. Go in 1 week(s).   Specialties: Cardiology, Internal Medicine Contact information: [place] [address] [phone]         Valera Castle, MD. Schedule an appointment as soon as possible for a visit in 1 week(s).   Specialty: Family Medicine Why: To be seen with repeat labs (CBC & CMP). Contact information: [place] [address] [phone]                  Home Health: None    Equipment/Devices: None    Discharge Condition: Improved and stable.   Code Status: Full Code Diet recommendation:  Discharge Diet Orders (From admission, onward)     Start     Ordered   09/11/22 0000  Diet - low sodium heart healthy        09/11/22 1658             Discharge Diagnoses:  Principal Problem:   Severe sepsis with acute organ dysfunction (HCC) Active Problems:   BPH (benign prostatic hyperplasia)   Mixed hyperlipidemia   Hypertension   Atrial fibrillation, chronic (HCC)   Elevated troponin   Chronic atrial fibrillation with RVR ([place])   COVID-19 virus detected   Brief Summary: 89 year old married male, has a walker which she does not use, PMH of HTN, HLD, chronic A-fib, CAD, presented to the ED on 09/09/2022 with complaints of worsening weakness, nausea, vomiting and diarrhea.  He was found to have COVID-19 infection, A-fib with RVR and elevated troponin levels in the absence of chest pain.  Cardiology was consulted.  He has been transitioned off of IV heparin to Eliquis.  Elevated troponins suspected due to demand ischemia.     Assessment & Plan:    COVID-19 infection: Mostly asymptomatic.  Despite intermittent mild  tachypnea, tachycardia (secondary to A-fib with RVR), was not hypoxic, lactate was normal.  Does not meet criteria for sepsis based on sepsis 3 guidelines.  Thereby sepsis ruled out.  Remains on room air.  CTA chest showed multifocal pneumonia but no PE.  Discussed with patient and son regarding isolation/quarantine x 10 days.  His diarrhea has resolved.  RVP negative   A-fib with RVR: Briefly on IV Cardizem drip.  This was transitioned initially to oral Cardizem 60 mg every 6 hours per cardiology, their input appreciated.  On home amiodarone 200 Mg daily and Toprol-XL 50 Mg daily.  Briefly on IV heparin drip which has been transitioned to prior home Eliquis.  Rate controlled on telemetry.  As communicated with cardiology on secure chat, discontinued newly started aspirin 81 Mg daily to minimize bleeding risks while on Eliquis.  For the same reason, also discontinued as needed ibuprofen 800 mg.  Cardiology has consolidated Cardizem to Cardizem CD 180 mg daily beginning 1/11.  They have cleared him for discharge home and will follow-up in the office in a week's time.  TSH normal.   Elevated troponin secondary to demand ischemia Denies chest pain.  Troponin peaked to 7039 but has down trended.  TTE shows LVEF of 70-75% and grade 1 diastolic dysfunction.  As per cardiology input 1/9: Doubt MI and suspected this was most likely secondary to demand ischemia and  did not recommend any invasive procedures.  CTA chest without pulmonary embolism.   Asymptomatic bacteriuria: Urine microscopy had shown rare bacteria, nitrite positive but significant pyuria.  Patient was started on empiric IV ceftriaxone.  In the absence of UTI symptoms, no fever or leukocytosis, and moreover urine culture showing multiple species, not indicative of UTI.  Discontinued ceftriaxone.   Hypertension: Controlled.  Prior home amlodipine and Zestoretic discontinued.  Now controlled on newly started Cardizem and prior home dose of Toprol-XL.    CAD: Troponin discussion as noted above.  Newly started on aspirin while here, consider discontinuing at discharge to avoid bleeding risks when given along with Eliquis especially when MI not suspected.  Please see discussion above, continue Toprol-XL.  Aspirin discontinued at time of discharge.   AKI complicating underlying CKD stage IIIa: Resolved.  Follow BMP as outpatient.   Mild transaminitis: Unclear etiology.  Outpatient follow-up.  Better compared to prior results   Anemia: May be dilutional due to IV fluids.  No bleeding reported.  Follow CBC as outpatient.   Body mass index is 23.57 kg/m.      Consultants:   Cardiology.   Procedures:      Discharge Instructions  Discharge Instructions     Call MD for:  difficulty breathing, headache or visual disturbances   Complete by: As directed    Call MD for:  extreme fatigue   Complete by: As directed    Call MD for:  persistant dizziness or light-headedness   Complete by: As directed    Call MD for:  persistant nausea and vomiting   Complete by: As directed    Call MD for:  severe uncontrolled pain   Complete by: As directed    Call MD for:  temperature >100.4   Complete by: As directed    Diet - low sodium heart healthy   Complete by: As directed    Discharge instructions   Complete by: As directed    Patient/family advised that patient has to isolate/quarantine due to COVID-19 infection for 10 days starting 09/09/2022.   Increase activity slowly   Complete by: As directed         Medication List     STOP taking these medications    amLODipine 5 MG tablet Commonly known as: NORVASC   finasteride 5 MG tablet Commonly known as: PROSCAR   HYDROcodone-acetaminophen 5-325 MG tablet Commonly known as: Norco   ibuprofen 800 MG tablet Commonly known as: ADVIL   levofloxacin 500 MG tablet Commonly known as: Levaquin   lisinopril-hydrochlorothiazide 20-12.5 MG tablet Commonly known as: ZESTORETIC    tamsulosin 0.4 MG Caps capsule Commonly known as: FLOMAX       TAKE these medications    acetaminophen 325 MG tablet Commonly known as: TYLENOL Take 2 tablets (650 mg total) by mouth every 6 (six) hours as needed for mild pain or moderate pain (or Fever >/= 101).   amiodarone 200 MG tablet Commonly known as: PACERONE Take 1 tablet (200 mg total) by mouth daily.   apixaban 5 MG Tabs tablet Commonly known as: ELIQUIS Take 1 tablet (5 mg total) by mouth 2 (two) times daily.   diltiazem 180 MG 24 hr capsule Commonly known as: CARDIZEM CD Take 1 capsule (180 mg total) by mouth daily. Start taking on: September 12, 2022   metoprolol succinate 50 MG 24 hr tablet Commonly known as: TOPROL-XL Take 1 tablet by mouth daily.   Orgovyx 120 MG Tabs Generic drug: Relugolix Take  120 mg by mouth daily. Pt gets from [facility]       No Known Allergies    Procedures/Studies: ECHOCARDIOGRAM COMPLETE  Result Date: 09/11/2022    ECHOCARDIOGRAM REPORT   Patient Name:   KYON BENTLER Date of Exam: 09/10/2022 Medical Rec #:  [id number]      Height:       68.0 in Accession #:    [id number]     Weight:       155.0 lb Date of Birth:  1930-03-10      BSA:          1.834 m Patient Age:    56 years       BP:           105/57 mmHg Patient Gender: M              HR:           78 bpm. Exam Location:  [facility] Procedure: 2D Echo, Cardiac Doppler and Color Doppler Indications:     Elevated troponin  History:         Patient has no prior history of Echocardiogram examinations.                  Arrythmias:Atrial Fibrillation and Tachycardia; Risk                  Factors:Hypertension and Dyslipidemia. Sepsis,.  Sonographer:     Wenda Low Referring Phys:  [id number] Bayard TANG Diagnosing Phys: Yolonda Kida MD IMPRESSIONS  1. Left ventricular ejection fraction, by estimation, is 70 to 75%. The left ventricle has hyperdynamic function. The left ventricle has no regional wall motion abnormalities. The  left ventricular internal cavity size was mildly to moderately dilated. There is mild concentric left ventricular hypertrophy. Left ventricular diastolic parameters are consistent with Grade I diastolic dysfunction (impaired relaxation).  2. Right ventricular systolic function is normal. The right ventricular size is normal. There is mildly elevated pulmonary artery systolic pressure.  3. Left atrial size was mild to moderately dilated.  4. Right atrial size was mild to moderately dilated.  5. The mitral valve is grossly normal. Moderate mitral valve regurgitation.  6. The aortic valve is normal in structure. Aortic valve regurgitation is not visualized. FINDINGS  Left Ventricle: Left ventricular ejection fraction, by estimation, is 70 to 75%. The left ventricle has hyperdynamic function. The left ventricle has no regional wall motion abnormalities. The left ventricular internal cavity size was mildly to moderately dilated. There is mild concentric left ventricular hypertrophy. Left ventricular diastolic parameters are consistent with Grade I diastolic dysfunction (impaired relaxation). Right Ventricle: The right ventricular size is normal. No increase in right ventricular wall thickness. Right ventricular systolic function is normal. There is mildly elevated pulmonary artery systolic pressure. The tricuspid regurgitant velocity is 2.90  m/s, and with an assumed right atrial pressure of 8 mmHg, the estimated right ventricular systolic pressure is 62.8 mmHg. Left Atrium: Left atrial size was mild to moderately dilated. Right Atrium: Right atrial size was mild to moderately dilated. Pericardium: There is no evidence of pericardial effusion. Mitral Valve: The mitral valve is grossly normal. Moderate mitral valve regurgitation. MV peak gradient, 11.7 mmHg. The mean mitral valve gradient is 4.0 mmHg. Tricuspid Valve: The tricuspid valve is normal in structure. Tricuspid valve regurgitation is mild. Aortic Valve: The  aortic valve is normal in structure. Aortic valve regurgitation is not visualized. Aortic valve mean gradient measures 2.0 mmHg. Aortic valve  peak gradient measures 4.1 mmHg. Aortic valve area, by VTI measures 2.24 cm. Pulmonic Valve: The pulmonic valve was normal in structure. Pulmonic valve regurgitation is not visualized. Aorta: The ascending aorta was not well visualized. IAS/Shunts: No atrial level shunt detected by color flow Doppler.  LEFT VENTRICLE PLAX 2D LVIDd:         5.20 cm LVIDs:         3.10 cm LV PW:         1.20 cm LV IVS:        1.10 cm LVOT diam:     2.00 cm LV SV:         44 LV SV Index:   24 LVOT Area:     3.14 cm  RIGHT VENTRICLE RV Basal diam:  3.40 cm RV Mid diam:    3.00 cm RV S prime:     13.40 cm/s TAPSE (M-mode): 2.0 cm LEFT ATRIUM             Index        RIGHT ATRIUM           Index LA diam:        5.00 cm 2.73 cm/m   RA Area:     18.20 cm LA Vol (A2C):   93.7 ml 51.10 ml/m  RA Volume:   42.80 ml  23.34 ml/m LA Vol (A4C):   84.4 ml 46.02 ml/m LA Biplane Vol: 96.3 ml 52.51 ml/m  AORTIC VALVE                    PULMONIC VALVE AV Area (Vmax):    2.16 cm     PV Vmax:       0.77 m/s AV Area (Vmean):   2.25 cm     PV Peak grad:  2.4 mmHg AV Area (VTI):     2.24 cm AV Vmax:           101.00 cm/s AV Vmean:          64.600 cm/s AV VTI:            0.198 m AV Peak Grad:      4.1 mmHg AV Mean Grad:      2.0 mmHg LVOT Vmax:         69.45 cm/s LVOT Vmean:        46.200 cm/s LVOT VTI:          0.141 m LVOT/AV VTI ratio: 0.71  AORTA Ao Root diam: 3.10 cm MITRAL VALVE                  TRICUSPID VALVE MV Area (PHT): 3.77 cm       TR Peak grad:   33.6 mmHg MV Area VTI:   1.53 cm       TR Vmax:        290.00 cm/s MV Peak grad:  11.7 mmHg MV Mean grad:  4.0 mmHg       SHUNTS MV Vmax:       1.71 m/s       Systemic VTI:  0.14 m MV Vmean:      83.8 cm/s      Systemic Diam: 2.00 cm MV Decel Time: 201 msec MR Peak grad:    56.0 mmHg MR Mean grad:    38.0 mmHg MR Vmax:         374.00 cm/s MR Vmean:         291.0 cm/s MR  PISA:         2.26 cm MR PISA Eff ROA: 56 mm MR PISA Radius:  0.60 cm MV E velocity: 129.00 cm/s Yolonda Kida MD Electronically signed by Yolonda Kida MD Signature Date/Time: 09/11/2022/8:08:08 AM    Final    CT Angio Chest Pulmonary Embolism (PE) W or WO Contrast  Result Date: 09/09/2022 CLINICAL DATA:  Progressive weakness. EXAM: CT ANGIOGRAPHY CHEST WITH CONTRAST TECHNIQUE: Multidetector CT imaging of the chest was performed using the standard protocol during bolus administration of intravenous contrast. Multiplanar CT image reconstructions and MIPs were obtained to evaluate the vascular anatomy. RADIATION DOSE REDUCTION: This exam was performed according to the departmental dose-optimization program which includes automated exposure control, adjustment of the mA and/or kV according to patient size and/or use of iterative reconstruction technique. CONTRAST:  69m OMNIPAQUE IOHEXOL 350 MG/ML SOLN COMPARISON:  None Available. FINDINGS: Cardiovascular: There is no evidence of large central pulmonary embolus seen in the main pulmonary artery or the main portions of the left and right pulmonary arteries as well as in the upper lobe branches. However, there is very limited contrast opacification of the lower lobe branches most likely due to timing of contrast bolus as well as some degree of respiratory motion artifact. Therefore, smaller and more peripheral pulmonary emboli in the lower lobe branches of the pulmonary arteries cannot be excluded. No pericardial effusion. Coronary artery calcifications are noted. Atherosclerosis of thoracic aorta is noted without aneurysm formation. Mediastinum/Nodes: No enlarged mediastinal, hilar, or axillary lymph nodes. Thyroid gland, trachea, and esophagus demonstrate no significant findings. Lungs/Pleura: No pneumothorax or pleural effusion is noted. Patchy airspace opacities are noted in the right upper and lower lobes as well as in left basilar  region, concerning for multifocal pneumonia. Bronchial wall thickening is noted in the lower lobes suggesting some degree of bronchitis. Upper Abdomen: No acute abnormality. Musculoskeletal: No chest wall abnormality. No acute or significant osseous findings. Review of the MIP images confirms the above findings. IMPRESSION: There is no definite evidence of large central pulmonary embolus seen in the main pulmonary artery or the main portions of the left and right pulmonary arteries. However, evaluation of the lower lobe branches of both pulmonary arteries is extremely limited due to limited contrast opacification most likely due to timing of contrast bolus as well as some degree of respiratory motion artifact. Therefore, smaller and more peripheral pulmonary emboli cannot be excluded on the basis of this exam. Patchy airspace opacities are noted in the right upper and lower lobes as well as in the left basilar region, concerning for multifocal pneumonia. Bronchial wall thickening is noted in both lower lobes suggesting some degree of bronchitis. Coronary artery calcifications are noted suggesting coronary artery disease. Aortic Atherosclerosis (ICD10-I70.0). Electronically Signed   By: JMarijo ConceptionM.D.   On: 09/09/2022 14:19   DG Chest 2 View  Result Date: 09/09/2022 CLINICAL DATA:  Shortness of breath. Weakness and dry cough for a few days. EXAM: CHEST - 2 VIEW COMPARISON:  Chest two views 08/30/2018 FINDINGS: Cardiac silhouette and mediastinal contours are within limits. Moderate calcification within the aortic arch. Resolution of prior mild interstitial thickening. Overall improved aeration without definite acute airspace opacity seen. No pleural effusion pneumothorax. Mild-to-moderate multilevel degenerative disc changes of the thoracic spine. IMPRESSION: Resolution of prior mild interstitial thickening. Overall improved aeration without definite acute airspace opacity seen. Electronically Signed   By:  RYvonne KendallM.D.   On: 09/09/2022 11:48  Subjective: Patient reports that he presented to the hospital because there was "something wrong with my heart rhythm". Also stated that he had diarrhea which has now resolved. No chest pain, dyspnea or palpitations. Occasional dry cough. Wants to be hooked off wires so he can get up and use the bathroom.  Also was eager to discharge home today.  Discharge Exam:  Vitals:   09/11/22 1345 09/11/22 1500 09/11/22 1530 09/11/22 1630  BP:  116/64 121/81 124/73  Pulse:  70 67 (!) 58  Resp: 19 (!) 28 (!) 25 16  Temp:      TempSrc:      SpO2:  97% 94% 98%  Weight:      Height:        General exam: Elderly male, moderately built and nourished lying comfortably propped up in bed without distress. Respiratory system: Clear to auscultation anteriorly but slightly harsh posteriorly.  However no wheezing, rhonchi or crackles. Respiratory effort normal. Cardiovascular system: S1 & S2 heard, RRR. No JVD, murmurs, rubs, gallops or clicks. No pedal edema.  Telemetry personally reviewed: A-fib with controlled ventricular rate. Gastrointestinal system: Abdomen is nondistended, soft and nontender. No organomegaly or masses felt. Normal bowel sounds heard. Central nervous system: Alert and oriented. No focal neurological deficits.  Extremely hard of hearing. Extremities: Symmetric 5 x 5 power. Skin: No rashes, lesions or ulcers Psychiatry: Judgement and insight appear normal. Mood & affect appropriate.     The results of significant diagnostics from this hospitalization (including imaging, microbiology, ancillary and laboratory) are listed below for reference.     Microbiology: Recent Results (from the past 240 hour(s))  Resp panel by RT-PCR (RSV, Flu A&B, Covid) Anterior Nasal Swab     Status: Abnormal   Collection Time: 09/09/22 10:55 AM   Specimen: Anterior Nasal Swab  Result Value Ref Range Status   SARS Coronavirus 2 by RT PCR POSITIVE (A)  NEGATIVE Final    Comment: (NOTE) SARS-CoV-2 target nucleic acids are DETECTED.  The SARS-CoV-2 RNA is generally detectable in upper respiratory specimens during the acute phase of infection. Positive results are indicative of the presence of the identified virus, but do not rule out bacterial infection or co-infection with other pathogens not detected by the test. Clinical correlation with patient history and other diagnostic information is necessary to determine patient infection status. The expected result is Negative.  Fact Sheet for Patients: EntrepreneurPulse.com.au  Fact Sheet for Healthcare Providers: IncredibleEmployment.be  This test is not yet approved or cleared by the [place] FDA and  has been authorized for detection and/or diagnosis of SARS-CoV-2 by FDA under an Emergency Use Authorization (EUA).  This EUA will remain in effect (meaning this test can be used) for the duration of  the COVID-19 declaration under Section 564(b)(1) of the A ct, 21 U.S.C. section 360bbb-3(b)(1), unless the authorization is terminated or revoked sooner.     Influenza A by PCR NEGATIVE NEGATIVE Final   Influenza B by PCR NEGATIVE NEGATIVE Final    Comment: (NOTE) The Xpert Xpress SARS-CoV-2/FLU/RSV plus assay is intended as an aid in the diagnosis of influenza from Nasopharyngeal swab specimens and should not be used as a sole basis for treatment. Nasal washings and aspirates are unacceptable for Xpert Xpress SARS-CoV-2/FLU/RSV testing.  Fact Sheet for Patients: EntrepreneurPulse.com.au  Fact Sheet for Healthcare Providers: IncredibleEmployment.be  This test is not yet approved or cleared by the [place] FDA and has been authorized for detection and/or diagnosis of SARS-CoV-2 by FDA under  an Emergency Use Authorization (EUA). This EUA will remain in effect (meaning this test can be used) for the  duration of the COVID-19 declaration under Section 564(b)(1) of the Act, 21 U.S.C. section 360bbb-3(b)(1), unless the authorization is terminated or revoked.     Resp Syncytial Virus by PCR NEGATIVE NEGATIVE Final    Comment: (NOTE) Fact Sheet for Patients: EntrepreneurPulse.com.au  Fact Sheet for Healthcare Providers: IncredibleEmployment.be  This test is not yet approved or cleared by the [place] FDA and has been authorized for detection and/or diagnosis of SARS-CoV-2 by FDA under an Emergency Use Authorization (EUA). This EUA will remain in effect (meaning this test can be used) for the duration of the COVID-19 declaration under Section 564(b)(1) of the Act, 21 U.S.C. section 360bbb-3(b)(1), unless the authorization is terminated or revoked.  Performed at [facility], [address] [place], [place] [address]   Blood culture (routine x 2)     Status: None (Preliminary result)   Collection Time: 09/09/22  3:18 PM   Specimen: BLOOD  Result Value Ref Range Status   Specimen Description BLOOD BLOOD LEFT ARM  Final   Special Requests   Final    BOTTLES DRAWN AEROBIC AND ANAEROBIC Blood Culture adequate volume   Culture   Final    NO GROWTH 2 DAYS Performed at [facility], [address], [place], [place] [address]    Report Status PENDING  Incomplete  Blood culture (routine x 2)     Status: None (Preliminary result)   Collection Time: 09/09/22  3:18 PM   Specimen: BLOOD  Result Value Ref Range Status   Specimen Description BLOOD BLOOD RIGHT ARM  Final   Special Requests   Final    BOTTLES DRAWN AEROBIC AND ANAEROBIC Blood Culture adequate volume   Culture   Final    NO GROWTH 2 DAYS Performed at [facility], [address], [place], [place] [address]    Report Status PENDING  Incomplete  Urine Culture     Status: Abnormal   Collection Time: 09/09/22  3:50 PM   Specimen: Urine, Clean Catch   Result Value Ref Range Status   Specimen Description   Final    URINE, CLEAN CATCH Performed at [facility], [address] [place], [place] [address]    Special Requests   Final    NONE Performed at [facility], [address], [place], [place] [address]    Culture MULTIPLE SPECIES PRESENT, SUGGEST RECOLLECTION (A)  Final   Report Status 09/10/2022 FINAL  Final  MRSA Next Gen by PCR, Nasal     Status: None   Collection Time: 09/09/22  6:34 PM   Specimen: Nasal Mucosa; Nasal Swab  Result Value Ref Range Status   MRSA by PCR Next Gen NOT DETECTED NOT DETECTED Final    Comment: (NOTE) The GeneXpert MRSA Assay (FDA approved for NASAL specimens only), is one component of a comprehensive MRSA colonization surveillance program. It is not intended to diagnose MRSA infection nor to guide or monitor treatment for MRSA infections. Test performance is not FDA approved in patients less than 43 years old. Performed at [facility], [address] [place], [place] [address]   Respiratory (~20 pathogens) panel by PCR     Status: None   Collection Time: 09/09/22  6:34 PM   Specimen: Nasal Mucosa; Respiratory  Result Value Ref Range Status   Adenovirus NOT DETECTED NOT DETECTED Final   Coronavirus 229E NOT  DETECTED NOT DETECTED Final    Comment: (NOTE) The Coronavirus on the Respiratory Panel, DOES NOT test for the novel  Coronavirus (2019 nCoV)    Coronavirus HKU1 NOT DETECTED NOT DETECTED Final   Coronavirus NL63 NOT DETECTED NOT DETECTED Final   Coronavirus OC43 NOT DETECTED NOT DETECTED Final   Metapneumovirus NOT DETECTED NOT DETECTED Final   Rhinovirus / Enterovirus NOT DETECTED NOT DETECTED Final   Influenza A NOT DETECTED NOT DETECTED Final   Influenza B NOT DETECTED NOT DETECTED Final   Parainfluenza Virus 1 NOT DETECTED NOT DETECTED Final   Parainfluenza Virus 2 NOT DETECTED NOT DETECTED Final   Parainfluenza Virus 3 NOT DETECTED NOT DETECTED  Final   Parainfluenza Virus 4 NOT DETECTED NOT DETECTED Final   Respiratory Syncytial Virus NOT DETECTED NOT DETECTED Final   Bordetella pertussis NOT DETECTED NOT DETECTED Final   Bordetella Parapertussis NOT DETECTED NOT DETECTED Final   Chlamydophila pneumoniae NOT DETECTED NOT DETECTED Final   Mycoplasma pneumoniae NOT DETECTED NOT DETECTED Final    Comment: Performed at [facility], [place] [address] [place], [place] [address]     Labs: CBC: Recent Labs  Lab 09/09/22 1055 09/10/22 0601  WBC 5.3 3.6*  HGB 11.3* 9.7*  HCT 35.3* 29.5*  MCV 88.9 88.6  PLT 226 710    Basic Metabolic Panel: Recent Labs  Lab 09/09/22 1055 09/09/22 1518 09/10/22 0601 09/11/22 0436  NA 138  --  137 138  K 4.1  --  3.6 4.2  CL 108  --  109 110  CO2 21*  --  20* 20*  GLUCOSE 147*  --  116* 116*  BUN 31*  --  26* 23  CREATININE 1.45*  --  1.22 1.17  CALCIUM 8.4*  --  7.8* 8.2*  MG 2.1 2.0  --  2.1  PHOS  --  2.9  --   --     Liver Function Tests: Recent Labs  Lab 09/09/22 1055 09/10/22 0601  AST 75* 65*  ALT 48* 45*  ALKPHOS 80 73  BILITOT 0.9 0.7  PROT 6.7 5.9*  ALBUMIN 3.3* 3.0*    Thyroid function studies Recent Labs    09/09/22 1055  TSH 2.182     Urinalysis    Component Value Date/Time   COLORURINE YELLOW (A) 09/09/2022 1550   APPEARANCEUR HAZY (A) 09/09/2022 1550   APPEARANCEUR Clear 06/13/2016 0843   LABSPEC 1.031 (H) 09/09/2022 1550   PHURINE 5.0 09/09/2022 1550   GLUCOSEU NEGATIVE 09/09/2022 1550   HGBUR MODERATE (A) 09/09/2022 1550   BILIRUBINUR NEGATIVE 09/09/2022 1550   BILIRUBINUR Negative 06/13/2016 0843   KETONESUR NEGATIVE 09/09/2022 1550   PROTEINUR 30 (A) 09/09/2022 1550   NITRITE POSITIVE (A) 09/09/2022 1550   LEUKOCYTESUR LARGE (A) 09/09/2022 1550    Discussed in detail with patient's son via phone, updated care and answered all questions.  Time coordinating discharge: 25 minutes  SIGNED:  Vernell Leep, MD,  FACP, [facility], [facility],  [facility], [facility]   [facility]     To contact the attending provider between 7A-7P or the covering provider during after hours 7P-7A, please log into the web site [email or web address] and access using universal [place] password for that web site. If you do not have the password, please call the hospital operator.

## 2022-09-11 NOTE — ED Notes (Signed)
Pt's son requesting update from "cardiology"; notified attending and cards via secure chat. Son Lupita Dawn number is [phone].

## 2022-09-11 NOTE — Discharge Instructions (Addendum)

## 2022-09-11 NOTE — ED Notes (Signed)
PT called to see if okay to work with pt now; told okay that this RN knows of. PT states will stop by soon to check.

## 2022-09-11 NOTE — ED Notes (Signed)
Pt verbalizes understanding of discharge instructions. Opportunity for questioning and answers were provided. Pt discharged from ED to home with son.    

## 2022-09-11 NOTE — ED Notes (Signed)
Visitor remains at bedside with pt. Pt watching tv.

## 2022-09-11 NOTE — Progress Notes (Signed)
[facility] Cardiology    SUBJECTIVE: Patient states he received reasonably well no worsening shortness of breath no fever feels well enough to be discharged home   Vitals:   09/11/22 1059 09/11/22 1100 09/11/22 1104 09/11/22 1105  BP:  133/64    Pulse: 88 66 70 93  Resp:  (!) 22  17  Temp:      TempSrc:      SpO2:  98%  100%  Weight:      Height:         Intake/Output Summary (Last 24 hours) at 09/11/2022 1230 Last data filed at 09/10/2022 1343 Gross per 24 hour  Intake --  Output 275 ml  Net -275 ml      PHYSICAL EXAM  General: Well developed, well nourished, in no acute distress HEENT:  Normocephalic and atramatic Neck:  No JVD.  Lungs: Clear bilaterally to auscultation and percussion. Heart: HRRR . Normal S1 and S2 without gallops or murmurs.  Abdomen: Bowel sounds are positive, abdomen soft and non-tender  Msk:  Back normal, normal gait. Normal strength and tone for age. Extremities: No clubbing, cyanosis or edema.   Neuro: Alert and oriented X 3. Psych:  Good affect, responds appropriately   LABS: Basic Metabolic Panel: Recent Labs    09/09/22 1518 09/10/22 0601 09/11/22 0436  NA  --  137 138  K  --  3.6 4.2  CL  --  109 110  CO2  --  20* 20*  GLUCOSE  --  116* 116*  BUN  --  26* 23  CREATININE  --  1.22 1.17  CALCIUM  --  7.8* 8.2*  MG 2.0  --  2.1  PHOS 2.9  --   --    Liver Function Tests: Recent Labs    09/09/22 1055 09/10/22 0601  AST 75* 65*  ALT 48* 45*  ALKPHOS 80 73  BILITOT 0.9 0.7  PROT 6.7 5.9*  ALBUMIN 3.3* 3.0*   No results for input(s): "LIPASE", "AMYLASE" in the last 72 hours. CBC: Recent Labs    09/09/22 1055 09/10/22 0601  WBC 5.3 3.6*  HGB 11.3* 9.7*  HCT 35.3* 29.5*  MCV 88.9 88.6  PLT 226 193   Cardiac Enzymes: No results for input(s): "CKTOTAL", "CKMB", "CKMBINDEX", "TROPONINI" in the last 72 hours. BNP: Invalid input(s): "POCBNP" D-Dimer: No results for input(s): "DDIMER" in the last 72 hours. Hemoglobin  A1C: No results for input(s): "HGBA1C" in the last 72 hours. Fasting Lipid Panel: No results for input(s): "CHOL", "HDL", "LDLCALC", "TRIG", "CHOLHDL", "LDLDIRECT" in the last 72 hours. Thyroid Function Tests: Recent Labs    09/09/22 1055  TSH 2.182   Anemia Panel: No results for input(s): "VITAMINB12", "FOLATE", "FERRITIN", "TIBC", "IRON", "RETICCTPCT" in the last 72 hours.  ECHOCARDIOGRAM COMPLETE  Result Date: 09/11/2022    ECHOCARDIOGRAM REPORT   Patient Name:   Evan Ramirez Date of Exam: 09/10/2022 Medical Rec #:  [id number]      Height:       68.0 in Accession #:    [id number]     Weight:       155.0 lb Date of Birth:  07-06-1930      BSA:          1.834 m Patient Age:    89 years       BP:           105/57 mmHg Patient Gender: M  HR:           78 bpm. Exam Location:  [facility] Procedure: 2D Echo, Cardiac Doppler and Color Doppler Indications:     Elevated troponin  History:         Patient has no prior history of Echocardiogram examinations.                  Arrythmias:Atrial Fibrillation and Tachycardia; Risk                  Factors:Hypertension and Dyslipidemia. Sepsis,.  Sonographer:     Wenda Low Referring Phys:  [id number] Florence TANG Diagnosing Phys: Yolonda Kida MD IMPRESSIONS  1. Left ventricular ejection fraction, by estimation, is 70 to 75%. The left ventricle has hyperdynamic function. The left ventricle has no regional wall motion abnormalities. The left ventricular internal cavity size was mildly to moderately dilated. There is mild concentric left ventricular hypertrophy. Left ventricular diastolic parameters are consistent with Grade I diastolic dysfunction (impaired relaxation).  2. Right ventricular systolic function is normal. The right ventricular size is normal. There is mildly elevated pulmonary artery systolic pressure.  3. Left atrial size was mild to moderately dilated.  4. Right atrial size was mild to moderately dilated.  5. The mitral valve is  grossly normal. Moderate mitral valve regurgitation.  6. The aortic valve is normal in structure. Aortic valve regurgitation is not visualized. FINDINGS  Left Ventricle: Left ventricular ejection fraction, by estimation, is 70 to 75%. The left ventricle has hyperdynamic function. The left ventricle has no regional wall motion abnormalities. The left ventricular internal cavity size was mildly to moderately dilated. There is mild concentric left ventricular hypertrophy. Left ventricular diastolic parameters are consistent with Grade I diastolic dysfunction (impaired relaxation). Right Ventricle: The right ventricular size is normal. No increase in right ventricular wall thickness. Right ventricular systolic function is normal. There is mildly elevated pulmonary artery systolic pressure. The tricuspid regurgitant velocity is 2.90  m/s, and with an assumed right atrial pressure of 8 mmHg, the estimated right ventricular systolic pressure is 44.0 mmHg. Left Atrium: Left atrial size was mild to moderately dilated. Right Atrium: Right atrial size was mild to moderately dilated. Pericardium: There is no evidence of pericardial effusion. Mitral Valve: The mitral valve is grossly normal. Moderate mitral valve regurgitation. MV peak gradient, 11.7 mmHg. The mean mitral valve gradient is 4.0 mmHg. Tricuspid Valve: The tricuspid valve is normal in structure. Tricuspid valve regurgitation is mild. Aortic Valve: The aortic valve is normal in structure. Aortic valve regurgitation is not visualized. Aortic valve mean gradient measures 2.0 mmHg. Aortic valve peak gradient measures 4.1 mmHg. Aortic valve area, by VTI measures 2.24 cm. Pulmonic Valve: The pulmonic valve was normal in structure. Pulmonic valve regurgitation is not visualized. Aorta: The ascending aorta was not well visualized. IAS/Shunts: No atrial level shunt detected by color flow Doppler.  LEFT VENTRICLE PLAX 2D LVIDd:         5.20 cm LVIDs:         3.10 cm LV PW:          1.20 cm LV IVS:        1.10 cm LVOT diam:     2.00 cm LV SV:         44 LV SV Index:   24 LVOT Area:     3.14 cm  RIGHT VENTRICLE RV Basal diam:  3.40 cm RV Mid diam:    3.00 cm RV S prime:  13.40 cm/s TAPSE (M-mode): 2.0 cm LEFT ATRIUM             Index        RIGHT ATRIUM           Index LA diam:        5.00 cm 2.73 cm/m   RA Area:     18.20 cm LA Vol (A2C):   93.7 ml 51.10 ml/m  RA Volume:   42.80 ml  23.34 ml/m LA Vol (A4C):   84.4 ml 46.02 ml/m LA Biplane Vol: 96.3 ml 52.51 ml/m  AORTIC VALVE                    PULMONIC VALVE AV Area (Vmax):    2.16 cm     PV Vmax:       0.77 m/s AV Area (Vmean):   2.25 cm     PV Peak grad:  2.4 mmHg AV Area (VTI):     2.24 cm AV Vmax:           101.00 cm/s AV Vmean:          64.600 cm/s AV VTI:            0.198 m AV Peak Grad:      4.1 mmHg AV Mean Grad:      2.0 mmHg LVOT Vmax:         69.45 cm/s LVOT Vmean:        46.200 cm/s LVOT VTI:          0.141 m LVOT/AV VTI ratio: 0.71  AORTA Ao Root diam: 3.10 cm MITRAL VALVE                  TRICUSPID VALVE MV Area (PHT): 3.77 cm       TR Peak grad:   33.6 mmHg MV Area VTI:   1.53 cm       TR Vmax:        290.00 cm/s MV Peak grad:  11.7 mmHg MV Mean grad:  4.0 mmHg       SHUNTS MV Vmax:       1.71 m/s       Systemic VTI:  0.14 m MV Vmean:      83.8 cm/s      Systemic Diam: 2.00 cm MV Decel Time: 201 msec MR Peak grad:    56.0 mmHg MR Mean grad:    38.0 mmHg MR Vmax:         374.00 cm/s MR Vmean:        291.0 cm/s MR PISA:         2.26 cm MR PISA Eff ROA: 56 mm MR PISA Radius:  0.60 cm MV E velocity: 129.00 cm/s Yolonda Kida MD Electronically signed by Yolonda Kida MD Signature Date/Time: 09/11/2022/8:08:08 AM    Final    CT Angio Chest Pulmonary Embolism (PE) W or WO Contrast  Result Date: 09/09/2022 CLINICAL DATA:  Progressive weakness. EXAM: CT ANGIOGRAPHY CHEST WITH CONTRAST TECHNIQUE: Multidetector CT imaging of the chest was performed using the standard protocol during bolus administration of  intravenous contrast. Multiplanar CT image reconstructions and MIPs were obtained to evaluate the vascular anatomy. RADIATION DOSE REDUCTION: This exam was performed according to the departmental dose-optimization program which includes automated exposure control, adjustment of the mA and/or kV according to patient size and/or use of iterative reconstruction technique. CONTRAST:  6m OMNIPAQUE IOHEXOL 350 MG/ML SOLN COMPARISON:  None Available.  FINDINGS: Cardiovascular: There is no evidence of large central pulmonary embolus seen in the main pulmonary artery or the main portions of the left and right pulmonary arteries as well as in the upper lobe branches. However, there is very limited contrast opacification of the lower lobe branches most likely due to timing of contrast bolus as well as some degree of respiratory motion artifact. Therefore, smaller and more peripheral pulmonary emboli in the lower lobe branches of the pulmonary arteries cannot be excluded. No pericardial effusion. Coronary artery calcifications are noted. Atherosclerosis of thoracic aorta is noted without aneurysm formation. Mediastinum/Nodes: No enlarged mediastinal, hilar, or axillary lymph nodes. Thyroid gland, trachea, and esophagus demonstrate no significant findings. Lungs/Pleura: No pneumothorax or pleural effusion is noted. Patchy airspace opacities are noted in the right upper and lower lobes as well as in left basilar region, concerning for multifocal pneumonia. Bronchial wall thickening is noted in the lower lobes suggesting some degree of bronchitis. Upper Abdomen: No acute abnormality. Musculoskeletal: No chest wall abnormality. No acute or significant osseous findings. Review of the MIP images confirms the above findings. IMPRESSION: There is no definite evidence of large central pulmonary embolus seen in the main pulmonary artery or the main portions of the left and right pulmonary arteries. However, evaluation of the lower lobe  branches of both pulmonary arteries is extremely limited due to limited contrast opacification most likely due to timing of contrast bolus as well as some degree of respiratory motion artifact. Therefore, smaller and more peripheral pulmonary emboli cannot be excluded on the basis of this exam. Patchy airspace opacities are noted in the right upper and lower lobes as well as in the left basilar region, concerning for multifocal pneumonia. Bronchial wall thickening is noted in both lower lobes suggesting some degree of bronchitis. Coronary artery calcifications are noted suggesting coronary artery disease. Aortic Atherosclerosis (ICD10-I70.0). Electronically Signed   By: Marijo Conception M.D.   On: 09/09/2022 14:19     Echo preserved left ventricular function EF of 75%  TELEMETRY: Atrial fibrillation rate of 75:  ASSESSMENT AND PLAN:  Principal Problem:   Severe sepsis with acute organ dysfunction (HCC) Active Problems:   BPH (benign prostatic hyperplasia)   Mixed hyperlipidemia   Hypertension   Atrial fibrillation, chronic (HCC)   Elevated troponin   Chronic atrial fibrillation with RVR ([place])   COVID-19 virus detected    Plan Severe sepsis with endorgan dysfunction improved history of sepsis treated aggressively patient with significant improvement discharge home Mixed hyperlipidemia continue statin therapy for lipid management Chronic atrial fibrillation anticoagulation rate control Hypertension reasonably controlled continue blood pressure management with current therapy Elevated troponins consistent with demand ischemia and not a non-STEMI COVID-19 infection improved no hypoxemia stable   Yolonda Kida, MD 09/11/2022 12:30 PM

## 2022-09-11 NOTE — ED Notes (Signed)
Pt received lunch tray and drink.

## 2022-09-11 NOTE — Progress Notes (Signed)
PROGRESS NOTE   Evan Ramirez  SEG:[id number]    DOB: 03-04-1930    DOA: 09/09/2022  PCP: Valera Castle, MD   I have briefly reviewed patients previous medical records in [facility]  Chief Complaint  Patient presents with   Weakness    Brief Narrative:  89 year old married male, has a walker which she does not use, PMH of HTN, HLD, chronic A-fib, CAD, presented to the ED on 09/09/2022 with complaints of worsening weakness, nausea, vomiting and diarrhea.  He was found to have COVID-19 infection, A-fib with RVR and elevated troponin levels in the absence of chest pain.  Cardiology was consulted.  He has been transitioned off of IV heparin to Eliquis.  Elevated troponins suspected due to demand ischemia.   Assessment & Plan:  Principal Problem:   Severe sepsis with acute organ dysfunction (HCC) Active Problems:   BPH (benign prostatic hyperplasia)   Mixed hyperlipidemia   Hypertension   Atrial fibrillation, chronic (HCC)   Elevated troponin   Chronic atrial fibrillation with RVR ([place])   COVID-19 virus detected   COVID-19 infection: Mostly asymptomatic.  Despite intermittent mild tachypnea, tachycardia (secondary to A-fib with RVR), was not hypoxic, lactate was normal.  Does not meet criteria for sepsis based on sepsis 3 guidelines.  Thereby sepsis ruled out.  Remains on room air.  CTA chest showed multifocal pneumonia but no PE.  A-fib with RVR: Briefly on IV Cardizem drip.  Now on oral Cardizem 60 mg every 6 hours per cardiology, their input appreciated.  On home amiodarone 200 Mg daily and Toprol-XL 50 Mg daily.  Briefly on IV heparin drip which has been transitioned to Eliquis.  Rate controlled on telemetry.  Elevated troponin secondary to demand ischemia Denies chest pain.  Troponin peaked to 7039 but is down trended.  TTE shows LVEF of 70-75% and grade 1 diastolic dysfunction.  As per cardiology input 1/9: Doubted MI and suspected this was most likely secondary to  demand ischemia and did not recommend any invasive procedures.  CTA chest without pulmonary embolism.  Asymptomatic bacteriuria: Urine microscopy had shown rare bacteria but significant pyuria.  Patient was started on empiric IV ceftriaxone.  In the absence of UTI symptoms, no fever or leukocytosis, and moreover urine culture showing multiple species, not indicative of UTI.  Discontinued ceftriaxone.  Hypertension: Mildly uncontrolled at times but mostly controlled.  Continue meds as noted above.  CAD: Troponin discussion as noted above.  Newly started on aspirin while here, consider discontinuing at discharge to avoid bleeding risks when given along with Eliquis especially when MI not suspected.  Await cardiology input.  AKI complicating underlying CKD stage IIIa: Resolved.  Follow BMP as outpatient.  Mild transaminitis: Unclear etiology.  Outpatient follow-up.  Better compared to prior results  Anemia: May be dilutional due to IV fluids.  No bleeding reported.  Follow CBC as outpatient.   Body mass index is 23.57 kg/m.    DVT prophylaxis: Place TED hose Start: 09/09/22 1454     Code Status: Full Code:  Family Communication: None at bedside Disposition:  Status is: Inpatient Remains inpatient appropriate because: Pending cardiology clearance, PT evaluation, possible DC home later today versus tomorrow.     Consultants:   Cardiology.  Procedures:     Antimicrobials:      Subjective:  Patient reports that he presented to the hospital because there was "something wrong with my heart rhythm".  Also stated that he had diarrhea which has now  resolved.  No chest pain, dyspnea or palpitations.  Occasional dry cough.  Wants to be hooked off wires so he can get up and use the bathroom.  Objective:   Vitals:   09/11/22 0321 09/11/22 0651 09/11/22 0700 09/11/22 0730  BP: (!) 123/54 (!) 160/73 (!) 148/76 (!) 135/90  Pulse: 71  (!) 43 64  Resp: 16  (!) 23 (!) 21  Temp: 98.1 F  (36.7 C)     TempSrc: Oral     SpO2: 96%  94% 100%  Weight:      Height:        General exam: Elderly male, moderately built and nourished lying comfortably propped up in bed without distress. Respiratory system: Clear to auscultation anteriorly but slightly harsh posteriorly.  However no wheezing, rhonchi or crackles. Respiratory effort normal. Cardiovascular system: S1 & S2 heard, RRR. No JVD, murmurs, rubs, gallops or clicks. No pedal edema.  Telemetry personally reviewed: A-fib with controlled ventricular rate. Gastrointestinal system: Abdomen is nondistended, soft and nontender. No organomegaly or masses felt. Normal bowel sounds heard. Central nervous system: Alert and oriented. No focal neurological deficits.  Extremely hard of hearing. Extremities: Symmetric 5 x 5 power. Skin: No rashes, lesions or ulcers Psychiatry: Judgement and insight appear normal. Mood & affect appropriate.     Data Reviewed:   I have personally reviewed following labs and imaging studies   CBC: Recent Labs  Lab 09/09/22 1055 09/10/22 0601  WBC 5.3 3.6*  HGB 11.3* 9.7*  HCT 35.3* 29.5*  MCV 88.9 88.6  PLT 226 237    Basic Metabolic Panel: Recent Labs  Lab 09/09/22 1055 09/09/22 1518 09/10/22 0601 09/11/22 0436  NA 138  --  137 138  K 4.1  --  3.6 4.2  CL 108  --  109 110  CO2 21*  --  20* 20*  GLUCOSE 147*  --  116* 116*  BUN 31*  --  26* 23  CREATININE 1.45*  --  1.22 1.17  CALCIUM 8.4*  --  7.8* 8.2*  MG 2.1 2.0  --  2.1  PHOS  --  2.9  --   --     Liver Function Tests: Recent Labs  Lab 09/09/22 1055 09/10/22 0601  AST 75* 65*  ALT 48* 45*  ALKPHOS 80 73  BILITOT 0.9 0.7  PROT 6.7 5.9*  ALBUMIN 3.3* 3.0*    CBG: No results for input(s): "GLUCAP" in the last 168 hours.  Microbiology Studies:   Recent Results (from the past 240 hour(s))  Resp panel by RT-PCR (RSV, Flu A&B, Covid) Anterior Nasal Swab     Status: Abnormal   Collection Time: 09/09/22 10:55 AM    Specimen: Anterior Nasal Swab  Result Value Ref Range Status   SARS Coronavirus 2 by RT PCR POSITIVE (A) NEGATIVE Final    Comment: (NOTE) SARS-CoV-2 target nucleic acids are DETECTED.  The SARS-CoV-2 RNA is generally detectable in upper respiratory specimens during the acute phase of infection. Positive results are indicative of the presence of the identified virus, but do not rule out bacterial infection or co-infection with other pathogens not detected by the test. Clinical correlation with patient history and other diagnostic information is necessary to determine patient infection status. The expected result is Negative.  Fact Sheet for Patients: EntrepreneurPulse.com.au  Fact Sheet for Healthcare Providers: IncredibleEmployment.be  This test is not yet approved or cleared by the [place] FDA and  has been authorized for detection and/or diagnosis of SARS-CoV-2  by FDA under an Emergency Use Authorization (EUA).  This EUA will remain in effect (meaning this test can be used) for the duration of  the COVID-19 declaration under Section 564(b)(1) of the A ct, 21 U.S.C. section 360bbb-3(b)(1), unless the authorization is terminated or revoked sooner.     Influenza A by PCR NEGATIVE NEGATIVE Final   Influenza B by PCR NEGATIVE NEGATIVE Final    Comment: (NOTE) The Xpert Xpress SARS-CoV-2/FLU/RSV plus assay is intended as an aid in the diagnosis of influenza from Nasopharyngeal swab specimens and should not be used as a sole basis for treatment. Nasal washings and aspirates are unacceptable for Xpert Xpress SARS-CoV-2/FLU/RSV testing.  Fact Sheet for Patients: EntrepreneurPulse.com.au  Fact Sheet for Healthcare Providers: IncredibleEmployment.be  This test is not yet approved or cleared by the [place] FDA and has been authorized for detection and/or diagnosis of SARS-CoV-2 by FDA under an  Emergency Use Authorization (EUA). This EUA will remain in effect (meaning this test can be used) for the duration of the COVID-19 declaration under Section 564(b)(1) of the Act, 21 U.S.C. section 360bbb-3(b)(1), unless the authorization is terminated or revoked.     Resp Syncytial Virus by PCR NEGATIVE NEGATIVE Final    Comment: (NOTE) Fact Sheet for Patients: EntrepreneurPulse.com.au  Fact Sheet for Healthcare Providers: IncredibleEmployment.be  This test is not yet approved or cleared by the [place] FDA and has been authorized for detection and/or diagnosis of SARS-CoV-2 by FDA under an Emergency Use Authorization (EUA). This EUA will remain in effect (meaning this test can be used) for the duration of the COVID-19 declaration under Section 564(b)(1) of the Act, 21 U.S.C. section 360bbb-3(b)(1), unless the authorization is terminated or revoked.  Performed at [facility], [address] [place], [place] [address]   Blood culture (routine x 2)     Status: None (Preliminary result)   Collection Time: 09/09/22  3:18 PM   Specimen: BLOOD  Result Value Ref Range Status   Specimen Description BLOOD BLOOD LEFT ARM  Final   Special Requests   Final    BOTTLES DRAWN AEROBIC AND ANAEROBIC Blood Culture adequate volume   Culture   Final    NO GROWTH 2 DAYS Performed at [facility], [address] [place], [place] [address]    Report Status PENDING  Incomplete  Blood culture (routine x 2)     Status: None (Preliminary result)   Collection Time: 09/09/22  3:18 PM   Specimen: BLOOD  Result Value Ref Range Status   Specimen Description BLOOD BLOOD RIGHT ARM  Final   Special Requests   Final    BOTTLES DRAWN AEROBIC AND ANAEROBIC Blood Culture adequate volume   Culture   Final    NO GROWTH 2 DAYS Performed at [facility], [address], [place], [place] [address]    Report Status PENDING  Incomplete   Urine Culture     Status: Abnormal   Collection Time: 09/09/22  3:50 PM   Specimen: Urine, Clean Catch  Result Value Ref Range Status   Specimen Description   Final    URINE, CLEAN CATCH Performed at [facility], [address], [place], [place] [address]    Special Requests   Final    NONE Performed at [facility], [address], [place], [place] [address]    Culture MULTIPLE SPECIES PRESENT, SUGGEST RECOLLECTION (A)  Final   Report Status 09/10/2022 FINAL  Final  MRSA Next Gen by  PCR, Nasal     Status: None   Collection Time: 09/09/22  6:34 PM   Specimen: Nasal Mucosa; Nasal Swab  Result Value Ref Range Status   MRSA by PCR Next Gen NOT DETECTED NOT DETECTED Final    Comment: (NOTE) The GeneXpert MRSA Assay (FDA approved for NASAL specimens only), is one component of a comprehensive MRSA colonization surveillance program. It is not intended to diagnose MRSA infection nor to guide or monitor treatment for MRSA infections. Test performance is not FDA approved in patients less than 31 years old. Performed at [facility], [address] [place], [place] [address]   Respiratory (~20 pathogens) panel by PCR     Status: None   Collection Time: 09/09/22  6:34 PM   Specimen: Nasal Mucosa; Respiratory  Result Value Ref Range Status   Adenovirus NOT DETECTED NOT DETECTED Final   Coronavirus 229E NOT DETECTED NOT DETECTED Final    Comment: (NOTE) The Coronavirus on the Respiratory Panel, DOES NOT test for the novel  Coronavirus (2019 nCoV)    Coronavirus HKU1 NOT DETECTED NOT DETECTED Final   Coronavirus NL63 NOT DETECTED NOT DETECTED Final   Coronavirus OC43 NOT DETECTED NOT DETECTED Final   Metapneumovirus NOT DETECTED NOT DETECTED Final   Rhinovirus / Enterovirus NOT DETECTED NOT DETECTED Final   Influenza A NOT DETECTED NOT DETECTED Final   Influenza B NOT DETECTED NOT DETECTED Final   Parainfluenza Virus 1 NOT DETECTED NOT DETECTED Final    Parainfluenza Virus 2 NOT DETECTED NOT DETECTED Final   Parainfluenza Virus 3 NOT DETECTED NOT DETECTED Final   Parainfluenza Virus 4 NOT DETECTED NOT DETECTED Final   Respiratory Syncytial Virus NOT DETECTED NOT DETECTED Final   Bordetella pertussis NOT DETECTED NOT DETECTED Final   Bordetella Parapertussis NOT DETECTED NOT DETECTED Final   Chlamydophila pneumoniae NOT DETECTED NOT DETECTED Final   Mycoplasma pneumoniae NOT DETECTED NOT DETECTED Final    Comment: Performed at [facility], [place], [place], [place] [address]    Radiology Studies:  ECHOCARDIOGRAM COMPLETE  Result Date: 09/11/2022    ECHOCARDIOGRAM REPORT   Patient Name:   Evan Ramirez Date of Exam: 09/10/2022 Medical Rec #:  [id number]      Height:       68.0 in Accession #:    [id number]     Weight:       155.0 lb Date of Birth:  [birth year]      BSA:          1.834 m Patient Age:    53 years       BP:           105/57 mmHg Patient Gender: M              HR:           78 bpm. Exam Location:  [facility] Procedure: 2D Echo, Cardiac Doppler and Color Doppler Indications:     Elevated troponin  History:         Patient has no prior history of Echocardiogram examinations.                  Arrythmias:Atrial Fibrillation and Tachycardia; Risk                  Factors:Hypertension and Dyslipidemia. Sepsis,.  Sonographer:     Wenda Low Referring Phys:  [id number] [place] TANG Diagnosing Phys: Yolonda Kida MD IMPRESSIONS  1. Left ventricular ejection  fraction, by estimation, is 70 to 75%. The left ventricle has hyperdynamic function. The left ventricle has no regional wall motion abnormalities. The left ventricular internal cavity size was mildly to moderately dilated. There is mild concentric left ventricular hypertrophy. Left ventricular diastolic parameters are consistent with Grade I diastolic dysfunction (impaired relaxation).  2. Right ventricular systolic function is normal. The right ventricular size is normal. There  is mildly elevated pulmonary artery systolic pressure.  3. Left atrial size was mild to moderately dilated.  4. Right atrial size was mild to moderately dilated.  5. The mitral valve is grossly normal. Moderate mitral valve regurgitation.  6. The aortic valve is normal in structure. Aortic valve regurgitation is not visualized. FINDINGS  Left Ventricle: Left ventricular ejection fraction, by estimation, is 70 to 75%. The left ventricle has hyperdynamic function. The left ventricle has no regional wall motion abnormalities. The left ventricular internal cavity size was mildly to moderately dilated. There is mild concentric left ventricular hypertrophy. Left ventricular diastolic parameters are consistent with Grade I diastolic dysfunction (impaired relaxation). Right Ventricle: The right ventricular size is normal. No increase in right ventricular wall thickness. Right ventricular systolic function is normal. There is mildly elevated pulmonary artery systolic pressure. The tricuspid regurgitant velocity is 2.90  m/s, and with an assumed right atrial pressure of 8 mmHg, the estimated right ventricular systolic pressure is 18.5 mmHg. Left Atrium: Left atrial size was mild to moderately dilated. Right Atrium: Right atrial size was mild to moderately dilated. Pericardium: There is no evidence of pericardial effusion. Mitral Valve: The mitral valve is grossly normal. Moderate mitral valve regurgitation. MV peak gradient, 11.7 mmHg. The mean mitral valve gradient is 4.0 mmHg. Tricuspid Valve: The tricuspid valve is normal in structure. Tricuspid valve regurgitation is mild. Aortic Valve: The aortic valve is normal in structure. Aortic valve regurgitation is not visualized. Aortic valve mean gradient measures 2.0 mmHg. Aortic valve peak gradient measures 4.1 mmHg. Aortic valve area, by VTI measures 2.24 cm. Pulmonic Valve: The pulmonic valve was normal in structure. Pulmonic valve regurgitation is not visualized. Aorta:  The ascending aorta was not well visualized. IAS/Shunts: No atrial level shunt detected by color flow Doppler.  LEFT VENTRICLE PLAX 2D LVIDd:         5.20 cm LVIDs:         3.10 cm LV PW:         1.20 cm LV IVS:        1.10 cm LVOT diam:     2.00 cm LV SV:         44 LV SV Index:   24 LVOT Area:     3.14 cm  RIGHT VENTRICLE RV Basal diam:  3.40 cm RV Mid diam:    3.00 cm RV S prime:     13.40 cm/s TAPSE (M-mode): 2.0 cm LEFT ATRIUM             Index        RIGHT ATRIUM           Index LA diam:        5.00 cm 2.73 cm/m   RA Area:     18.20 cm LA Vol (A2C):   93.7 ml 51.10 ml/m  RA Volume:   42.80 ml  23.34 ml/m LA Vol (A4C):   84.4 ml 46.02 ml/m LA Biplane Vol: 96.3 ml 52.51 ml/m  AORTIC VALVE  PULMONIC VALVE AV Area (Vmax):    2.16 cm     PV Vmax:       0.77 m/s AV Area (Vmean):   2.25 cm     PV Peak grad:  2.4 mmHg AV Area (VTI):     2.24 cm AV Vmax:           101.00 cm/s AV Vmean:          64.600 cm/s AV VTI:            0.198 m AV Peak Grad:      4.1 mmHg AV Mean Grad:      2.0 mmHg LVOT Vmax:         69.45 cm/s LVOT Vmean:        46.200 cm/s LVOT VTI:          0.141 m LVOT/AV VTI ratio: 0.71  AORTA Ao Root diam: 3.10 cm MITRAL VALVE                  TRICUSPID VALVE MV Area (PHT): 3.77 cm       TR Peak grad:   33.6 mmHg MV Area VTI:   1.53 cm       TR Vmax:        290.00 cm/s MV Peak grad:  11.7 mmHg MV Mean grad:  4.0 mmHg       SHUNTS MV Vmax:       1.71 m/s       Systemic VTI:  0.14 m MV Vmean:      83.8 cm/s      Systemic Diam: 2.00 cm MV Decel Time: 201 msec MR Peak grad:    56.0 mmHg MR Mean grad:    38.0 mmHg MR Vmax:         374.00 cm/s MR Vmean:        291.0 cm/s MR PISA:         2.26 cm MR PISA Eff ROA: 56 mm MR PISA Radius:  0.60 cm MV E velocity: 129.00 cm/s Yolonda Kida MD Electronically signed by Yolonda Kida MD Signature Date/Time: 09/11/2022/8:08:08 AM    Final    CT Angio Chest Pulmonary Embolism (PE) W or WO Contrast  Result Date: 09/09/2022 CLINICAL  DATA:  Progressive weakness. EXAM: CT ANGIOGRAPHY CHEST WITH CONTRAST TECHNIQUE: Multidetector CT imaging of the chest was performed using the standard protocol during bolus administration of intravenous contrast. Multiplanar CT image reconstructions and MIPs were obtained to evaluate the vascular anatomy. RADIATION DOSE REDUCTION: This exam was performed according to the departmental dose-optimization program which includes automated exposure control, adjustment of the mA and/or kV according to patient size and/or use of iterative reconstruction technique. CONTRAST:  66m OMNIPAQUE IOHEXOL 350 MG/ML SOLN COMPARISON:  None Available. FINDINGS: Cardiovascular: There is no evidence of large central pulmonary embolus seen in the main pulmonary artery or the main portions of the left and right pulmonary arteries as well as in the upper lobe branches. However, there is very limited contrast opacification of the lower lobe branches most likely due to timing of contrast bolus as well as some degree of respiratory motion artifact. Therefore, smaller and more peripheral pulmonary emboli in the lower lobe branches of the pulmonary arteries cannot be excluded. No pericardial effusion. Coronary artery calcifications are noted. Atherosclerosis of thoracic aorta is noted without aneurysm formation. Mediastinum/Nodes: No enlarged mediastinal, hilar, or axillary lymph nodes. Thyroid gland, trachea, and esophagus demonstrate no significant findings. Lungs/Pleura: No  pneumothorax or pleural effusion is noted. Patchy airspace opacities are noted in the right upper and lower lobes as well as in left basilar region, concerning for multifocal pneumonia. Bronchial wall thickening is noted in the lower lobes suggesting some degree of bronchitis. Upper Abdomen: No acute abnormality. Musculoskeletal: No chest wall abnormality. No acute or significant osseous findings. Review of the MIP images confirms the above findings. IMPRESSION: There is  no definite evidence of large central pulmonary embolus seen in the main pulmonary artery or the main portions of the left and right pulmonary arteries. However, evaluation of the lower lobe branches of both pulmonary arteries is extremely limited due to limited contrast opacification most likely due to timing of contrast bolus as well as some degree of respiratory motion artifact. Therefore, smaller and more peripheral pulmonary emboli cannot be excluded on the basis of this exam. Patchy airspace opacities are noted in the right upper and lower lobes as well as in the left basilar region, concerning for multifocal pneumonia. Bronchial wall thickening is noted in both lower lobes suggesting some degree of bronchitis. Coronary artery calcifications are noted suggesting coronary artery disease. Aortic Atherosclerosis (ICD10-I70.0). Electronically Signed   By: Marijo Conception M.D.   On: 09/09/2022 14:19   DG Chest 2 View  Result Date: 09/09/2022 CLINICAL DATA:  Shortness of breath. Weakness and dry cough for a few days. EXAM: CHEST - 2 VIEW COMPARISON:  Chest two views 08/30/2018 FINDINGS: Cardiac silhouette and mediastinal contours are within limits. Moderate calcification within the aortic arch. Resolution of prior mild interstitial thickening. Overall improved aeration without definite acute airspace opacity seen. No pleural effusion pneumothorax. Mild-to-moderate multilevel degenerative disc changes of the thoracic spine. IMPRESSION: Resolution of prior mild interstitial thickening. Overall improved aeration without definite acute airspace opacity seen. Electronically Signed   By: Yvonne Kendall M.D.   On: 09/09/2022 11:48    Scheduled Meds:    amiodarone  200 mg Oral Daily   apixaban  5 mg Oral BID   aspirin  81 mg Oral Daily   diltiazem  60 mg Oral Q6H   ipratropium-albuterol  3 mL Inhalation Q6H   metoprolol succinate  50 mg Oral Daily   Relugolix  120 mg Oral QAC breakfast    Continuous  Infusions:    cefTRIAXone (ROCEPHIN)  IV Stopped (09/10/22 1658)     LOS: 2 days     Vernell Leep, MD,  FACP, FHM, SFHM, [facility], [place]     To contact the attending provider between 7A-7P or the covering provider during after hours 7P-7A, please log into the web site [email or web address] and access using universal [place] password for that web site. If you do not have the password, please call the hospital operator.  09/11/2022, 8:23 AM

## 2022-09-11 NOTE — Evaluation (Signed)
Physical Therapy Evaluation Patient Details Name: Evan Ramirez MRN: [id number] DOB: 1929-09-27 Today's Date: 09/11/2022  History of Present Illness  89 y/o male presented to ED on 09/09/22 for progressive weakness over past few days and diarrhea. Found to test positive for COVID-19. Admitted for sepsis with acute organ dysfunction. PMH: HTN, chronic Afib, CAD  Clinical Impression  Patient admitted with the above. PTA, patient lives with wife and reports independence with all mobility with no AD and denies falls. Patient presents with weakness, impaired balance, and decreased activity tolerance. On arrival, patient standing at sink preparing to self cath (performs 2-3x/day at home). Ambulated within room with Rw and supervision. Encouraged use of RW at home for stability due to unsteadiness, patient verbalized understanding and in agreement. Patient will benefit from skilled PT services during acute stay to address listed deficits. No PT follow up recommended at this time as patient feels he will be able to manage safely at home.        Recommendations for follow up therapy are one component of a multi-disciplinary discharge planning process, led by the attending physician.  Recommendations may be updated based on patient status, additional functional criteria and insurance authorization.  Follow Up Recommendations No PT follow up      Assistance Recommended at Discharge Intermittent Supervision/Assistance  Patient can return home with the following  Assistance with cooking/housework;Help with stairs or ramp for entrance;Assist for transportation    Equipment Recommendations None recommended by PT  Recommendations for Other Services       Functional Status Assessment Patient has had a recent decline in their functional status and demonstrates the ability to make significant improvements in function in a reasonable and predictable amount of time.     Precautions / Restrictions  Precautions Precautions: Fall Restrictions Weight Bearing Restrictions: No      Mobility  Bed Mobility               General bed mobility comments: standing at sink on arrival    Transfers                        Ambulation/Gait Ambulation/Gait assistance: Supervision Gait Distance (Feet): 50 Feet Assistive device: Rolling Evan Ramirez (2 wheels) Gait Pattern/deviations: Step-through pattern, Decreased stride length, Scissoring Gait velocity: decreased     General Gait Details: mild scissoring noted with short ambulation distance. No overt LOB. General unsteadiness.  Stairs            Wheelchair Mobility    Modified Rankin (Stroke Patients Only)       Balance Overall balance assessment: Mild deficits observed, not formally tested                                           Pertinent Vitals/Pain Pain Assessment Pain Assessment: No/denies pain    Home Living Family/patient expects to be discharged to:: Private residence Living Arrangements: Spouse/significant other Available Help at Discharge: Family Type of Home: House Home Access: Stairs to enter       Home Layout: One level Home Equipment: Conservation officer, nature (2 wheels)      Prior Function Prior Level of Function : Independent/Modified Independent;Driving             Mobility Comments: denies falls. Drives. Ambulates with no AD ADLs Comments: independent. Self caths 2-3x/day     Hand Dominance  Extremity/Trunk Assessment   Upper Extremity Assessment Upper Extremity Assessment: Overall WFL for tasks assessed    Lower Extremity Assessment Lower Extremity Assessment: Generalized weakness    Cervical / Trunk Assessment Cervical / Trunk Assessment: Kyphotic  Communication   Communication: HOH  Cognition Arousal/Alertness: Awake/alert Behavior During Therapy: WFL for tasks assessed/performed Overall Cognitive Status: Within Functional Limits for tasks  assessed                                          General Comments      Exercises     Assessment/Plan    PT Assessment Patient needs continued PT services  PT Problem List Decreased strength;Decreased activity tolerance;Decreased balance;Decreased mobility       PT Treatment Interventions Gait training;DME instruction;Functional mobility training;Therapeutic activities;Therapeutic exercise;Stair training;Balance training;Patient/family education    PT Goals (Current goals can be found in the Care Plan section)  Acute Rehab PT Goals Patient Stated Goal: to go home PT Goal Formulation: With patient Time For Goal Achievement: 09/25/22 Potential to Achieve Goals: Good    Frequency Min 2X/week     Co-evaluation               AM-PAC PT "6 Clicks" Mobility  Outcome Measure Help needed turning from your back to your side while in a flat bed without using bedrails?: None Help needed moving from lying on your back to sitting on the side of a flat bed without using bedrails?: None Help needed moving to and from a bed to a chair (including a wheelchair)?: None Help needed standing up from a chair using your arms (e.g., wheelchair or bedside chair)?: None Help needed to walk in hospital room?: A Little Help needed climbing 3-5 steps with a railing? : A Little 6 Click Score: 22    End of Session   Activity Tolerance: Patient tolerated treatment well Patient left: Other (comment);with family/visitor present (at sink preparing to self cath) Nurse Communication: Mobility status PT Visit Diagnosis: Muscle weakness (generalized) (M62.81);Unsteadiness on feet (R26.81)    Time: 5852-7782 PT Time Calculation (min) (ACUTE ONLY): 15 min   Charges:   PT Evaluation $PT Eval Low Complexity: 1 Low          Evan Ramirez A. Evan Ramirez PT, DPT [facility] - Acute Rehabilitation Services   Evan Ramirez A Evan Ramirez 09/11/2022, 9:25 AM

## 2022-09-14 LAB — CULTURE, BLOOD (ROUTINE X 2)
Culture: NO GROWTH
Culture: NO GROWTH
Special Requests: ADEQUATE
Special Requests: ADEQUATE

## 2022-10-03 DEATH — deceased
